# Patient Record
Sex: Male | Born: 1935 | Race: White | Hispanic: No | Marital: Married | Smoking: Former smoker
Health system: Southern US, Community
[De-identification: ages and names within clinical notes are randomized; demographics above are authoritative.]

## PROBLEM LIST (undated history)

## (undated) DIAGNOSIS — I2510 Atherosclerotic heart disease of native coronary artery without angina pectoris: Secondary | ICD-10-CM

## (undated) DIAGNOSIS — R079 Chest pain, unspecified: Secondary | ICD-10-CM

## (undated) DIAGNOSIS — F329 Major depressive disorder, single episode, unspecified: Secondary | ICD-10-CM

## (undated) DIAGNOSIS — T8859XA Other complications of anesthesia, initial encounter: Secondary | ICD-10-CM

## (undated) DIAGNOSIS — T4145XA Adverse effect of unspecified anesthetic, initial encounter: Secondary | ICD-10-CM

## (undated) DIAGNOSIS — F32A Depression, unspecified: Secondary | ICD-10-CM

## (undated) DIAGNOSIS — M109 Gout, unspecified: Secondary | ICD-10-CM

## (undated) DIAGNOSIS — R972 Elevated prostate specific antigen [PSA]: Secondary | ICD-10-CM

## (undated) DIAGNOSIS — E785 Hyperlipidemia, unspecified: Secondary | ICD-10-CM

## (undated) DIAGNOSIS — N400 Enlarged prostate without lower urinary tract symptoms: Secondary | ICD-10-CM

## (undated) DIAGNOSIS — Z125 Encounter for screening for malignant neoplasm of prostate: Secondary | ICD-10-CM

## (undated) HISTORY — PX: CATARACT EXTRACTION W/ INTRAOCULAR LENS  IMPLANT, BILATERAL: SHX1307

## (undated) HISTORY — PX: TONSILLECTOMY: SUR1361

## (undated) HISTORY — PX: CORONARY STENT PLACEMENT: SHX1402

## (undated) HISTORY — DX: Hyperlipidemia, unspecified: E78.5

## (undated) HISTORY — PX: TONSILLECTOMY: SHX5217

## (undated) HISTORY — DX: Chest pain, unspecified: R07.9

## (undated) HISTORY — DX: Atherosclerotic heart disease of native coronary artery without angina pectoris: I25.10

## (undated) HISTORY — DX: Encounter for screening for malignant neoplasm of prostate: Z12.5

## (undated) HISTORY — DX: Elevated prostate specific antigen (PSA): R97.20

---

## 2006-03-10 ENCOUNTER — Ambulatory Visit: Payer: Self-pay | Admitting: Cardiology

## 2006-03-10 ENCOUNTER — Ambulatory Visit: Payer: Self-pay | Admitting: Internal Medicine

## 2006-03-10 ENCOUNTER — Inpatient Hospital Stay (HOSPITAL_COMMUNITY): Admission: EM | Admit: 2006-03-10 | Discharge: 2006-03-12 | Payer: Self-pay | Admitting: Emergency Medicine

## 2006-03-18 ENCOUNTER — Ambulatory Visit: Payer: Self-pay | Admitting: Cardiology

## 2006-05-07 ENCOUNTER — Ambulatory Visit: Payer: Self-pay | Admitting: Cardiology

## 2006-05-30 ENCOUNTER — Ambulatory Visit: Payer: Self-pay | Admitting: Cardiology

## 2006-08-19 ENCOUNTER — Ambulatory Visit: Payer: Self-pay

## 2007-02-26 ENCOUNTER — Ambulatory Visit: Payer: Self-pay | Admitting: Cardiology

## 2008-02-02 ENCOUNTER — Ambulatory Visit: Payer: Self-pay

## 2008-02-04 ENCOUNTER — Ambulatory Visit: Payer: Self-pay | Admitting: Cardiology

## 2009-01-19 ENCOUNTER — Encounter: Payer: Self-pay | Admitting: Family Medicine

## 2009-01-19 LAB — CONVERTED CEMR LAB: PSA: 2.74 ng/mL

## 2009-03-17 DIAGNOSIS — R079 Chest pain, unspecified: Secondary | ICD-10-CM

## 2009-03-17 DIAGNOSIS — E785 Hyperlipidemia, unspecified: Secondary | ICD-10-CM

## 2009-03-17 DIAGNOSIS — I2510 Atherosclerotic heart disease of native coronary artery without angina pectoris: Secondary | ICD-10-CM

## 2009-03-28 ENCOUNTER — Ambulatory Visit: Payer: Self-pay | Admitting: Cardiology

## 2009-06-28 ENCOUNTER — Ambulatory Visit: Payer: Self-pay | Admitting: Family Medicine

## 2009-07-05 LAB — CONVERTED CEMR LAB
ALT: 19 units/L (ref 0–53)
Albumin: 4.6 g/dL (ref 3.5–5.2)
Alkaline Phosphatase: 62 units/L (ref 39–117)
Basophils Absolute: 0 10*3/uL (ref 0.0–0.1)
Basophils Relative: 0.1 % (ref 0.0–3.0)
Creatinine, Ser: 1 mg/dL (ref 0.4–1.5)
Glucose, Bld: 118 mg/dL — ABNORMAL HIGH (ref 70–99)
HDL: 33.9 mg/dL — ABNORMAL LOW (ref >39.00)
Hgb A1c MFr Bld: 5.8 % (ref 4.6–6.5)
Lymphocytes Relative: 20.8 % (ref 12.0–46.0)
Lymphs Abs: 1.2 10*3/uL (ref 0.7–4.0)
MCHC: 33.6 g/dL (ref 30.0–36.0)
PSA: 4.45 ng/mL — ABNORMAL HIGH (ref 0.10–4.00)
Phosphorus: 3.1 mg/dL (ref 2.3–4.6)
Potassium: 4.1 meq/L (ref 3.5–5.1)
RBC: 4.9 M/uL (ref 4.22–5.81)
Sodium: 140 meq/L (ref 135–145)
Total Bilirubin: 1.6 mg/dL — ABNORMAL HIGH (ref 0.3–1.2)
Total Protein: 6.9 g/dL (ref 6.0–8.3)
Triglycerides: 143 mg/dL (ref 0.0–149.0)
VLDL: 28.6 mg/dL (ref 0.0–40.0)

## 2009-07-24 ENCOUNTER — Encounter: Payer: Self-pay | Admitting: Family Medicine

## 2009-07-24 ENCOUNTER — Telehealth: Payer: Self-pay | Admitting: Family Medicine

## 2009-07-24 DIAGNOSIS — R972 Elevated prostate specific antigen [PSA]: Secondary | ICD-10-CM | POA: Insufficient documentation

## 2009-08-11 ENCOUNTER — Encounter: Payer: Self-pay | Admitting: Family Medicine

## 2009-08-21 ENCOUNTER — Telehealth: Payer: Self-pay | Admitting: Family Medicine

## 2009-08-23 ENCOUNTER — Ambulatory Visit: Payer: Self-pay | Admitting: Family Medicine

## 2011-03-05 NOTE — Assessment & Plan Note (Signed)
[name]                            CARDIOLOGY OFFICE NOTE   DALLIS, CZAJA                       MRN:          [id number]  DATE:02/04/2008                            DOB:          1934/03/21    Chad Sanford comes in today for follow-up of his coronary artery disease.  He is having no angina or ischemic symptoms.   He is status post a LAD Cypher stent to the LAD on Mar 11, 2006.  He has  nonobstructive disease otherwise.   His lipids are followed by Dr. Loma Sender.   CURRENT MEDICATIONS:  1. Aspirin 81 mg a day.  2. Simvastatin 40 mg nightly.  3. Multivitamin.   PHYSICAL EXAMINATION:  VITAL SIGNS:  His blood pressure is 128/80, pulse  72 and regular.  Weight is 200, up 6.  HEENT:  Unchanged.  NECK: Carotids upstrokes are equal bilateral without bruits.  No JVD.  Thyroid is not enlarged.  Trachea is midline.  LUNGS:  Clear to auscultation.  HEART:  Reveals a nondisplaced PMI.  Normal S1-S2.  ABDOMEN:  Slightly protuberant, good bowel sounds.  No tenderness.  No  obvious organomegaly.  No pulsatile mass.  EXTREMITIES:  No cyanosis, clubbing or edema.  Pulses are intact.  NEURO:  Intact.  SKIN:  Unremarkable.   He had an exercise rest stress Myoview on February 02, 2008.  He went 10  minutes and 33 seconds.  His heart rate went to 130 which is 87% of  predicted maximum heart rate.  His met level achieved was 11.3.  He did  have a hypertensive blood pressure response to exercise, though at the  stage that he normally pushes himself, he was in the 170s systolic.  His  scan showed normal perfusion, no evidence of ischemia or infarction.  EF  62%.   ASSESSMENT/PLAN:  Chad Sanford is doing well.  I have made no changes in  his program.  We reviewed the findings of his stress test in detail.  Will plan on seeing him back in a year.    Thomas C. Daleen Squibb, MD, [facility]  Electronically Signed   TCW/MedQ  DD: 02/04/2008  DT: 02/04/2008  Job #:  [id number]   cc:   Loma Sender

## 2011-03-05 NOTE — Assessment & Plan Note (Signed)
[facility]                            CARDIOLOGY OFFICE NOTE   Chad Sanford                       MRN:          [id number]  DATE:02/26/2007                            DOB:          February 15, 1934    Chad Sanford returns today for further management of his coronary artery  disease.  He is status post Cypher stent to the LAD on Mar 11, 2006.  He  had non-obstructive disease otherwise.   He had significant fatigue with Lopressor.  We stopped it and he got  better.   He has hyperlipidemia, and his lipids are being followed by Dr.  Vear Sanford.   He feels remarkably good without ischemic symptoms.   CURRENT MEDICATIONS:  1. Plavix 75 mg a day.  2. Vytorin 10/40.  3. Aspirin 325 a day.  4. Multivitamin daily.   PHYSICAL EXAMINATION:  VITAL SIGNS:  Blood pressure is 125/74, pulse 61  and regular.  Weight is 194.  HEENT:  Normocephalic and atraumatic.  Pupils equal, round and reactive  to light and accommodation.  Extraocular movements intact.  Sclerae are  clear.  Facial symmetry is normal.  Carotid upstrokes are equal  bilaterally without bruits.  There is no JVD.  Thyroid is not enlarged.  NECK:  Supple.  LUNGS:  Clear.  HEART:  Regular rate and rhythm.  Non-displaced PMI.  ABDOMEN:  Soft with good bowel sounds.  There is no midline bruit.  There is no hepatomegaly.  EXTREMITIES:  Without any clubbing, cyanosis, or edema.  Pulses are  brisk.  NEUROLOGIC:  Intact.   ELECTROCARDIOGRAM:  EKG is remarkable for sinus bradycardia; otherwise  normal.   ASSESSMENT AND PLAN:  Chad Sanford is doing well.  We renewed his  sublingual nitroglycerin.  Will discontinue his Plavix.  He will remain  on aspirin and Vytorin.  Labs will be followed by Chad Sanford.  I will  see him back in a year.     Chad C. Daleen Squibb, MD, [facility]  Electronically Signed   TCW/MedQ  DD: 02/26/2007  DT: 02/26/2007  Job #: [id number]   cc:   Chad Sanford

## 2011-03-08 NOTE — Consult Note (Signed)
Chad Sanford                ACCOUNT NO.:  [id number]   MEDICAL RECORD NO.:  [id number]          PATIENT TYPE:  INP   LOCATION:  3729                         FACILITY:  [facility]   PHYSICIAN:  Chad Sanford, M.D.   DATE OF BIRTH:  [birth year]   DATE OF CONSULTATION:  03/10/2006  DATE OF DISCHARGE:                                   CONSULTATION   We are asked by Dr. Meredith Pel to evaluate Chad Sanford, a delightful 81, soon  to be 77 year old, married white male, a retired Optician, dispensing, for exertional  angina.   This was new onset over the last several days.  He came to the emergency  room and a several nitroglycerin relieved it.   His risk factors are really age, sex, and a lot of stress.  There is a  family history of coronary disease and stroke.  Brother died of a heart  attack at age 15.   He does not know his current lipid status.  He last had it checked about a  year ago.   PAST MEDICAL HISTORY:  He has no known drug allergies.   He was o n a multivitamin prior to admission.  He is currently on aspirin,  Lovenox, Protonix, metoprolol 25 mg b.i.d..   Has had T&A.  He had a history of hemorrhoids.   SOCIAL HISTORY:  He lives in [place] with his wife.  Does not smoke but  has remote distant history of smoking.   FAMILY HISTORY:  As above.   REVIEW OF SYSTEMS:  Noncontributory or negative otherwise.   PHYSICAL EXAMINATION:  VITAL SIGNS:  His blood pressure 150/96.  His pulse  77 and regular, respiratory rate is 20, temperature is 98.4, saturations are  97% on room air.  GENERAL:  He is in no acute distress.  Skin is warm and dry.  NEUROLOGIC:  Intact.  HEENT:  Normocephalic, atraumatic.  PERRLA.  Extraocular movements intact.  Sclera clear.  Dentition is satisfactory.  NECK:  Carotid upstrokes were equal bilaterally without bruits.  No JVD.  Thyroid is not enlarged.  Trachea is midline.  LUNGS:  Clear to auscultation.  HEART:  Regular rate and rhythm without murmur,  gallop.  ABDOMEN:  Soft, good bowel sounds.  No midline bruits.  There is no  hepatomegaly.  There si no tenderness.  EXTREMITIES:  No sinus clubbing or edema.  Pulses are intact.   Electrocardiogram is essentially normal.   Laboratory data is unremarkable.  D-dimer was negative.  Point care markers  were negative x2.   ASSESSMENT/PLAN:  1.  Classic exertional angina, probably obstructive coronary disease.  2.  Question of hyperlipidemia.  3.  Family history of coronary disease.   RECOMMENDATIONS:  1.  Continue current medications.  2.  Fasting lipid panel.  3.  Cardiac catheterization.  Indications, benefit and risk have been      discussed with the patient and wife and they agree to proceed.  Will      schedule in the morning.      Chad Sanford, M.D.  Electronically Signed  TCW/MEDQ  D:  03/10/2006  T:  03/11/2006  Job:  [id number]   cc:   Loma Sender  Fax: [phone]   Ileana Roup, M.D.  Fax: [phone]

## 2011-03-08 NOTE — Assessment & Plan Note (Signed)
[facility]                              CARDIOLOGY OFFICE NOTE   KAROL, LIENDO                       MRN:          [id number]  DATE:05/30/2006                            DOB:          May 30, 1934    Mr. Chandonnet returns today for further management of his coronary artery  disease and hyperlipidemia.   Other than some fatigue and what sounds like some mild orthostatic symptoms  he is doing well.  He has cut-out salt completely and he has changed his  diet radically.   His lipid were at goal on Vytorin.  We stopped his Metoprolol last visit  trying to improve his fatigue.   MEDICATIONS:  1. He is on Plavix 75 mg a day.  2. Aspirin 325 a day.  3. Multivitamin.  4. Potassium over-the-counter.  5. Vitamin C q. day.   EXAMINATION:  VITAL SIGNS:  His blood pressure is 108/70.  His pulse is 70  and regular.  His weight is 191 and stable.  GENERAL:  He looks remarkably good.  HEART:  Reveals regular rate and rhythm.  LUNGS:  Clear.  EXTREMITIES:  No edema.  Pulses are intact.   Mr. Pursel is doing well.  I have asked him to stay on his current medical  program.  I have asked him to liberalize his salt and increase his fluids to  stay well hydrated.  I will see him back in May of 2008.                               Thomas C. Daleen Squibb, MD, [facility]    TCW/MedQ  DD:  05/30/2006  DT:  05/30/2006  Job #:  [id number]   cc:   Loma Sender

## 2011-03-08 NOTE — Discharge Summary (Signed)
Chad Sanford, SUGG NO.:  [id number]   MEDICAL RECORD NO.:  [id number]          PATIENT TYPE:  INP   LOCATION:  6525                         FACILITY:  [facility]   PHYSICIAN:  Ileana Roup, M.D.  DATE OF BIRTH:  12-10-1933   DATE OF ADMISSION:  03/10/2006  DATE OF DISCHARGE:  03/12/2006                                 DISCHARGE SUMMARY   DISCHARGE DIAGNOSES:  1.  Coronary artery disease status post left anterior descending stent      placed on Mar 11, 2006.  2.  Hypercholesterolemia.   DISCHARGE MEDICATIONS:  1.  Aspirin 325 mg p.o. daily.  2.  Plavix 75 mg p.o. daily.  3.  Vytorin 10/40 mg p.o. q.h.s.  4.  Lopressor 25 mg p.o. b.i.d.  5.  Nitroglycerin 0.4 mg sublingual p.r.n.  6.  Multivitamin daily.   FOLLOW UP:  1.  The patient is to follow up with Dr. Daleen Squibb of [facility] on Mar 18, 2006 status post catheterization and LAD stent placement on Mar 11, 2006.  2.  The patient is to follow up with his primary care physician, Dr.      Vear Clock, in [place].  The patient has been recently started on      Vytorin, as he had hypercholesterolemia and this needs to be monitored      by Dr. Vear Clock.  Liver function test is recommended in 4-6 week's time.   PROCEDURES DONE THIS ADMISSION:  Cardiac catheterization done on Mar 11, 2006 with LAD stent placement.   CONSULTANTS THIS ADMISSION:  Dr. Daleen Squibb of [facility] and Dr. Juanda Chance  of [facility].   IMAGES DONE THIS ADMISSION:  None.   BRIEF HISTORY AND PHYSICAL:  Please refer to medical record for further  details.  Chad Sanford is a 77 year old Caucasian man with no significant past  medical history that comes to the ED with a 1-week history of exertional  chest pain that starts when he goes for his walks and relieved on rest.  It  is associated with some minor shortness of breath, but no complaints of  diaphoresis, nausea or vomiting.  The patient denies any fever or  chills,  cough or any GI symptoms.   ALLERGIES:  NO KNOWN DRUG ALLERGIES.   PAST MEDICAL HISTORY:  Non-significant past medical history.   MEDICATIONS:  Multivitamin daily.   SUBSTANCE HISTORY:  The patient is a former smoker.  He quit 40 years ago.  The patient does not drink any alcohol and does not have a history of drug  abuse.   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   FAMILY HISTORY:  His mother died at 46 from a stroke.  Father died at 88  from a stroke as well.  Siblings:  The patient has 6 siblings and neither of  them have an early history of an MI.   PHYSICAL EXAMINATION:  VITAL SIGNS:  Temperature 98.4, blood pressure  158/96, pulse 77,  respiratory rate of 20 and O2 sat of 97% on room air.  GENERAL APPEARANCE:  The patient did not appear in any acute distress.  EYES:  Pupils equal, round, reactive to light.  Extraocular movements are  intact.  ENT:  Oropharynx is clear.  No erythema or exudate.  NECK:  Supple.  No adenopathy.  No JVD.  No carotid bruit.  LUNGS:  Clear to auscultation bilaterally.  No wheezes or rhonchi.  HEART:  Regular rhythm, no murmurs, rubs or gallops.  ABDOMEN:  Soft, nondistended, nontender and bowel sounds present.  EXTREMITIES:  No edema.  Pulses 2+ bilaterally.  NEUROLOGICALLY:  Alert and oriented x3.  Grossly nonfocal.  PSYCHIATRIC:  Appropriate.   LABORATORY:  Sodium 137, potassium 4.2, chloride 107, bicarb 27, BUN 13,  creatinine 1.2 and glucose of 97.  Hemoglobin 15, white cell count 10.9,  platelets oxygen saturation 284 and MCV 89.  ANC 8.2.  EKG shows normal  sinus rhythm, normal intervals and no ST or T wave changes.   HOSPITAL COURSE:  Problem #1.  Coronary artery disease.  The patient came in  with exertional chest pain and therefore was concerning.  He had a remote  history of smoking and has hypertension.  Lipid status was unknown on the  time of admission and no history  of diabetes.  The patient was given  aspirin, and nitroglycerin sublingually x1 in the ED and was also given a  beta blocker Toprol-XL 25 mg x1.  Blood pressure came down with the above  and a cardiology consult was called.  Dr. Daleen Squibb patient in the ED and patient  was scheduled for a cardiac cath the following day.  On Mar 11, 2006, the  patient had a cardiac catheterization done by Dr. Charlies Constable.  LAD was  found to be 95% stenosed and an LAD stent was placed.  His ejection fraction  was estimated around 60%.  Post catheterization, the patient did develop  some amount of swelling in his catheterization site.  It was a small 4 cm  swelling and mildly tender to touch.  Ultrasound was done and was found to  just be a hematoma and not a pseudoaneurysm.  The patient was therefore  stable to go home and it was okayed by cardiology for him to go home as  well.  The patient will be following up with Dr. Daleen Squibb on Mar 18, 2006.  The  patient has been started on Plavix 75 mg p.o. daily, along with continuation  of aspirin and Lopressor 25 mg b.i.d..  The patient was instructed if he  does get chest pain again that he needs to take nitroglycerin and report  immediately to the ED.   Problem #2.  Hypercholesterolemia.  Fasting lipid panel was done here in the  hospital.  He has a total cholesterol of 219, TG of 164, HDL of 31 and LDL  155.  VLDL of 33.  Vytorin 10/40 mg q.h.s. was added to his drug list.  The  patient needs follow up with his primary care physician regarding this issue  and needs his liver function test to be tested in 4-6 weeks.   DISCHARGE VITAL SIGNS:  Temperature 98.6, blood pressure in the range of 106-  118/52-60, pulse of 70-74, respiratory rate 18-20 and O2 sat 94% on room  air.   DISCHARGE LABORATORY DATA:  Sodium 140, potassium 4, chloride 106, bicarb 28, BUN 10, creatinine 1.1 and glucose 108.  Hemoglobin 14.4, hematocrit  42.3, white cell count 7.8 and platelets  275.      Chad Sanford, M.D.    ______________________________  Ileana Roup, M.D.    SS/MEDQ  D:  03/12/2006  T:  03/12/2006  Job:  [id number]   cc:   Loma Sender  Fax: [phone]   Jesse Sans. Wall, M.D.  [address]  [address]  [place]  [place] [address]

## 2011-03-08 NOTE — Cardiovascular Report (Signed)
NAME:  Chad Sanford, Chad Sanford NO.:  [id number]   MEDICAL RECORD NO.:  [id number]          PATIENT TYPE:  INP   LOCATION:  3729                         FACILITY:  [facility]   PHYSICIAN:  Charlies Constable, M.D. 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  He does have a positive family  history for heart disease and a markedly elevated LDL.  Over the last week  he has developed exertional chest pain and came to the emergency room  yesterday and was admitted by Dr. Terrial Rhodes service and seen in consultation  by Dr. Daleen Squibb and scheduled for evaluation angiography.   PROCEDURE:  The procedure was performed by the right femoral artery using an  arterial sheath and 6-French preformed coronary catheters.  A femoral wall  arterial puncture was performed and Omnipaque contrast was used.  After  completion of the diagnostic study we made a decision to proceed with  intervention on the lesion in the proximal LAD.   The patient was given Angiomax bolus infusion, was given 600 mg of Plavix  load, and 20 mg of Pepcid.  We used a Q-4 6-French guiding catheter with  side holes.  We crossed the lesion in the proximal LAD with a Prowater wire  without difficulty.  We predilated with 2.25 x 20 mm Maverick stent  performing two inflation up to 10 atmospheres for 30 seconds.  We then  deployed a 2.5 x 18 mm Cypher stent, deploying this with one inflation of 9  atmospheres for 30 seconds.  We post dilated with 0.5 x 50 mm Quantum  Maverick, performing two inflations up to 16 atmospheres for about 30  seconds.  Final diagnostic study was then performed through the guiding  catheter.  The patient tolerated the procedure well and left the laboratory  in satisfactory condition.   RESULTS:  The aortic  pressure was 132/79 with mean of 103 and left  ventricular pressure was 132/16.   The left main coronary artery is free of significant disease.   The left anterior descending artery gave rise to a large diagonal branch,  two septal perforators, and two small diagonal branches.  There was 40%  proximal and a 95% proximal stenosis in the LAD.  The 95% stenosis had  segmental disease extending about 16 mm.  There was 40% narrowing in the mid  LAD and irregularity throughout the proximal and mid LAD.  There was 50%  stenosis in one of the subbranches of the first large diagonal branch.   The circumflex artery gave rise to a small marginal branch and a large and  small posterolateral branch.  There were tandem 40% stenoses in the distal  circumflex artery before the posterolateral branches.   The right coronary artery is a large dominant vessel that gave rise to a  small ventricular branch, a moderate ventricular branch, two posterior  descending branches, and two posterolateral  branches.  There was 30% mid and  30% distal stenosis in the right coronary.   The left ventriculogram performed in the RAO projection showed good wall  motion with no areas of hypokinesis.  The estimated ejection fraction was  60%.   Following stenting of the lesion in the proximal LAD, the stenosis improved  from 95% to 0%.   CONCLUSION:  1.  Coronary artery disease with 40% proximal and 95% proximal stenosis in      the left anterior descending coronary artery, with 40% stenosis in the      mid left anterior descending coronary artery and 50% stenosis in the      first large diagonal branch; 40% narrowing in the distal circumflex      artery; 30% mid and 30% distal stenosis in the right coronary artery;      and normal left ventricular function.  2.  Successful percutaneous coronary intervention of the lesion in the      proximal left anterior descending coronary artery using a Cypher drug-      eluting  stent with improvement in percent of narrowing from 95 to 0%.   DISPOSITION:  The patient was transferred to the post angioplasty unit for  further observation.           ______________________________  Charlies Constable, M.D. [facility]     BB/MEDQ  D:  03/11/2006  T:  03/11/2006  Job:  [id number]   cc:   Loma Sender  Fax: [phone]   Jesse Sans. Wall, M.D.  [address]  [address]  [place]  [place] [address]   Cardiopulmonary Lab

## 2011-03-15 ENCOUNTER — Encounter: Payer: Self-pay | Admitting: Cardiology

## 2011-03-27 ENCOUNTER — Encounter: Payer: Self-pay | Admitting: Cardiology

## 2011-03-27 ENCOUNTER — Ambulatory Visit (INDEPENDENT_AMBULATORY_CARE_PROVIDER_SITE_OTHER): Payer: Medicare Other | Admitting: Cardiology

## 2011-03-27 VITALS — BP 122/82 | HR 67 | Ht 73.0 in | Wt 194.0 lb

## 2011-03-27 DIAGNOSIS — I2510 Atherosclerotic heart disease of native coronary artery without angina pectoris: Secondary | ICD-10-CM

## 2011-03-27 NOTE — Patient Instructions (Signed)
Your physician recommends that you schedule a follow-up appointment in: 1 year with Dr. Wall  

## 2011-03-27 NOTE — Assessment & Plan Note (Signed)
Stable, no change in meds. Return in one year.

## 2011-03-27 NOTE — Progress Notes (Signed)
HPI Chad Sanford comes in today for the evaluation of his coronary disease. He is 5 years out from his stent implantation. He is very active and is having no symptoms of angina or ischemia. He cannot take aspirin because of bleeding from the bladder. No specific etiology was determined by cystoscopy in the past.  His lipids are followed by Dr. Vear Clock. He remains on simvastatin. Past Medical History  Diagnosis Date  . Chest pain, unspecified     exertional  . CAD (coronary artery disease)     unspecified site, stent put in   . Hyperlipidemia, mixed     Past Surgical History  Procedure Date  . Tonsillectomy     No family history on file.  History   Social History  . Marital Status: Married    Spouse Name: N/A    Number of Children: N/A  . Years of Education: N/A   Occupational History  . retired      Optician, dispensing    Social History Main Topics  . Smoking status: Former Games developer  . Smokeless tobacco: Not on file   Comment: quit 40+ years ago   . Alcohol Use: No  . Drug Use: No  . Sexually Active: Not on file   Other Topics Concern  . Not on file   Social History Narrative   Married. Walks for exercise- really enjoys it.     Allergies  Allergen Reactions  . Aspirin     REACTION: blood in urine    Current Outpatient Prescriptions  Medication Sig Dispense Refill  . multivitamin (THERAGRAN) per tablet Take 1 tablet by mouth daily.        . nitroGLYCERIN (NITROSTAT) 0.4 MG SL tablet Place 0.4 mg under the tongue every 5 (five) minutes as needed.        . simvastatin (ZOCOR) 40 MG tablet Take 40 mg by mouth daily.          ROS Negative other than HPI.   PE General Appearance: well developed, well nourished in no acute distress HEENT: symmetrical face, PERRLA, good dentition  Neck: no JVD, thyromegaly, or adenopathy, trachea midline Chest: symmetric without deformity Cardiac: PMI non-displaced, RRR, normal S1, S2, no gallop or murmur Lung: clear to ausculation and  percussion Vascular: all pulses full without bruits  Abdominal: nondistended, nontender, good bowel sounds, no HSM, no bruits Extremities: no cyanosis, clubbing or edema, no sign of DVT, no varicosities  Skin: normal color, no rashes Neuro: alert and oriented x 3, non-focal Pysch: normal affect Filed Vitals:   03/27/11 1518  BP: 122/82  Pulse: 67  Height: 6\' 1"  (1.854 m)  Weight: 194 lb (87.998 kg)    EKG  Labs and Studies Reviewed.   Lab Results  Component Value Date   WBC 5.7 06/28/2009   HGB 15.1 06/28/2009   HCT 44.8 06/28/2009   MCV 91.5 06/28/2009   PLT 275.0 06/28/2009      Chemistry      Component Value Date/Time   NA 140 06/28/2009 1029   K 4.1 06/28/2009 1029   CL 104 06/28/2009 1029   CO2 28 06/28/2009 1029   BUN 12 06/28/2009 1029   CREATININE 1.0 06/28/2009 1029      Component Value Date/Time   CALCIUM 9.3 06/28/2009 1029   ALKPHOS 62 06/28/2009 1029   AST 27 06/28/2009 1029   ALT 19 06/28/2009 1029   BILITOT 1.6* 06/28/2009 1029       Lab Results  Component Value Date  CHOL 146 06/28/2009   Lab Results  Component Value Date   HDL 33.90* 06/28/2009   Lab Results  Component Value Date   LDLCALC 84 06/28/2009   Lab Results  Component Value Date   TRIG 143.0 06/28/2009   Lab Results  Component Value Date   CHOLHDL 4 06/28/2009   Lab Results  Component Value Date   HGBA1C 5.8 06/28/2009   Lab Results  Component Value Date   ALT 19 06/28/2009   AST 27 06/28/2009   ALKPHOS 62 06/28/2009   BILITOT 1.6* 06/28/2009   Lab Results  Component Value Date   TSH 1.48 06/28/2009

## 2011-03-29 NOTE — Progress Notes (Signed)
Addended by: Burnett Kanaris A on: 03/29/2011 11:15 AM   Modules accepted: Orders

## 2012-05-07 ENCOUNTER — Encounter: Payer: Self-pay | Admitting: *Deleted

## 2012-05-26 ENCOUNTER — Ambulatory Visit (INDEPENDENT_AMBULATORY_CARE_PROVIDER_SITE_OTHER): Payer: Medicare Other | Admitting: Cardiology

## 2012-05-26 ENCOUNTER — Encounter: Payer: Self-pay | Admitting: Cardiology

## 2012-05-26 VITALS — BP 144/84 | HR 65 | Ht 73.0 in | Wt 198.0 lb

## 2012-05-26 DIAGNOSIS — R079 Chest pain, unspecified: Secondary | ICD-10-CM

## 2012-05-26 DIAGNOSIS — E785 Hyperlipidemia, unspecified: Secondary | ICD-10-CM

## 2012-05-26 DIAGNOSIS — I2510 Atherosclerotic heart disease of native coronary artery without angina pectoris: Secondary | ICD-10-CM

## 2012-05-26 MED ORDER — NITROGLYCERIN 0.4 MG SL SUBL: 0.4000 mg | SUBLINGUAL_TABLET | SUBLINGUAL | Status: DC | PRN

## 2012-05-26 NOTE — Patient Instructions (Addendum)
Your physician recommends that you continue on your current medications as directed. Please refer to the Current Medication list given to you today.  Your physician recommends that you schedule a follow-up appointment in: 1year with Dr. Wall.  

## 2012-05-26 NOTE — Assessment & Plan Note (Signed)
Followed by Dr. Vear Clock of primary care.

## 2012-05-26 NOTE — Progress Notes (Signed)
HPI Mr Age returns today for evaluation and management of his history of coronary disease and PCI.  He's having no symptoms of angina or ischemic heart disease. He denies TIAs or any claudication.  He is compliant with his medications. He carries sublingual nitroglycerin. He has not had to use it.    Past Medical History  Diagnosis Date  . Chest pain, unspecified     exertional  . Coronary atherosclerosis of unspecified type of vessel, native or graft     unspecified site, stent put in   . Other and unspecified hyperlipidemia   . Elevated prostate specific antigen (PSA)   . Special screening for malignant neoplasm of prostate     Current Outpatient Prescriptions  Medication Sig Dispense Refill  . multivitamin (THERAGRAN) per tablet Take 1 tablet by mouth daily.        . nitroGLYCERIN (NITROSTAT) 0.4 MG SL tablet Place 0.4 mg under the tongue every 5 (five) minutes as needed.        . simvastatin (ZOCOR) 40 MG tablet Take 40 mg by mouth daily.          Allergies  Allergen Reactions  . Aspirin     REACTION: blood in urine    Family History  Problem Relation Age of Onset  . Stroke Mother 76  . Stroke Father 60    History   Social History  . Marital Status: Married    Spouse Name: N/A    Number of Children: N/A  . Years of Education: N/A   Occupational History  . retired      Optician, dispensing    Social History Main Topics  . Smoking status: Former Games developer  . Smokeless tobacco: Not on file   Comment: quit in [phone]  . Alcohol Use: No  . Drug Use: No  . Sexually Active: Not on file   Other Topics Concern  . Not on file   Social History Narrative   Married. Walks for exercise- really enjoys it.     ROS ALL NEGATIVE EXCEPT THOSE NOTED IN HPI  PE  General Appearance: well developed, well nourished in no acute distress, looks younger than stated age HEENT: symmetrical face, PERRLA, good dentition  Neck: no JVD, thyromegaly, or adenopathy, trachea  midline Chest: symmetric without deformity Cardiac: PMI non-displaced, RRR, normal S1, S2, no gallop or murmur Lung: clear to ausculation and percussion Vascular: all pulses full without bruits  Abdominal: nondistended, nontender, good bowel sounds, no HSM, no bruits Extremities: no cyanosis, clubbing or edema, no sign of DVT, no varicosities  Skin: normal color, no rashes Neuro: alert and oriented x 3, non-focal Pysch: normal affect  EKG Normal sinus rhythm, normal EKG BMET    Component Value Date/Time   NA 140 06/28/2009 1029   K 4.1 06/28/2009 1029   CL 104 06/28/2009 1029   CO2 28 06/28/2009 1029   GLUCOSE 118* 06/28/2009 1029   BUN 12 06/28/2009 1029   CREATININE 1.0 06/28/2009 1029   CALCIUM 9.3 06/28/2009 1029    Lipid Panel     Component Value Date/Time   CHOL 146 06/28/2009 1029   TRIG 143.0 06/28/2009 1029   HDL 33.90* 06/28/2009 1029   CHOLHDL 4 06/28/2009 1029   VLDL 28.6 06/28/2009 1029   LDLCALC 84 06/28/2009 1029    CBC    Component Value Date/Time   WBC 5.7 06/28/2009 1029   RBC 4.90 06/28/2009 1029   HGB 15.1 06/28/2009 1029   HCT 44.8 06/28/2009 1029  PLT 275.0 06/28/2009 1029   MCV 91.5 06/28/2009 1029   MCHC 33.6 06/28/2009 1029   RDW 12.3 06/28/2009 1029   LYMPHSABS 1.2 06/28/2009 1029   MONOABS 0.4 06/28/2009 1029   EOSABS 0.2 06/28/2009 1029   BASOSABS 0.0 06/28/2009 1029

## 2012-05-26 NOTE — Assessment & Plan Note (Signed)
Asymptomatic. Continue secondary preventative therapy. Nitroglycerin up to date.

## 2012-06-01 NOTE — Addendum Note (Signed)
Addended by: Micki Riley C on: 06/01/2012 03:47 PM   Modules accepted: Orders

## 2013-08-06 ENCOUNTER — Encounter (HOSPITAL_COMMUNITY): Payer: Self-pay | Admitting: Emergency Medicine

## 2013-08-06 ENCOUNTER — Encounter (HOSPITAL_COMMUNITY)
Admission: EM | Disposition: A | Discharge status: Home / Self Care | Payer: Self-pay | Source: Home / Self Care | Attending: Internal Medicine

## 2013-08-06 ENCOUNTER — Emergency Department (HOSPITAL_COMMUNITY): Payer: Medicare Other

## 2013-08-06 ENCOUNTER — Inpatient Hospital Stay (HOSPITAL_COMMUNITY)
Admission: EM | Admit: 2013-08-06 | Discharge: 2013-08-09 | DRG: 377 | Disposition: A | Discharge status: Home / Self Care | Payer: Medicare Other | Attending: Internal Medicine | Admitting: Internal Medicine

## 2013-08-06 DIAGNOSIS — K264 Chronic or unspecified duodenal ulcer with hemorrhage: Principal | ICD-10-CM | POA: Diagnosis present

## 2013-08-06 DIAGNOSIS — D62 Acute posthemorrhagic anemia: Secondary | ICD-10-CM | POA: Diagnosis present

## 2013-08-06 DIAGNOSIS — Z87891 Personal history of nicotine dependence: Secondary | ICD-10-CM

## 2013-08-06 DIAGNOSIS — Z9861 Coronary angioplasty status: Secondary | ICD-10-CM

## 2013-08-06 DIAGNOSIS — K922 Gastrointestinal hemorrhage, unspecified: Secondary | ICD-10-CM | POA: Diagnosis present

## 2013-08-06 DIAGNOSIS — R578 Other shock: Secondary | ICD-10-CM | POA: Diagnosis present

## 2013-08-06 DIAGNOSIS — E785 Hyperlipidemia, unspecified: Secondary | ICD-10-CM | POA: Diagnosis present

## 2013-08-06 DIAGNOSIS — I2510 Atherosclerotic heart disease of native coronary artery without angina pectoris: Secondary | ICD-10-CM | POA: Diagnosis present

## 2013-08-06 HISTORY — PX: ESOPHAGOGASTRODUODENOSCOPY: SHX5428

## 2013-08-06 LAB — OCCULT BLOOD, POC DEVICE: Fecal Occult Bld: POSITIVE — AB

## 2013-08-06 LAB — CBC WITH DIFFERENTIAL/PLATELET
Hemoglobin: 12.2 g/dL — ABNORMAL LOW (ref 13.0–17.0)
Lymphocytes Relative: 29 % (ref 12–46)
Lymphs Abs: 3.9 10*3/uL (ref 0.7–4.0)
MCHC: 35 g/dL (ref 30.0–36.0)
Monocytes Absolute: 1 10*3/uL (ref 0.1–1.0)
Monocytes Relative: 7 % (ref 3–12)
Neutro Abs: 7.9 10*3/uL — ABNORMAL HIGH (ref 1.7–7.7)
RBC: 3.95 MIL/uL — ABNORMAL LOW (ref 4.22–5.81)

## 2013-08-06 LAB — TYPE AND SCREEN
ABO/RH(D): A POS
Antibody Screen: NEGATIVE

## 2013-08-06 LAB — COMPREHENSIVE METABOLIC PANEL
Calcium: 8.5 mg/dL (ref 8.4–10.5)
Glucose, Bld: 153 mg/dL — ABNORMAL HIGH (ref 70–99)
Sodium: 141 mEq/L (ref 135–145)
Total Bilirubin: 0.6 mg/dL (ref 0.3–1.2)

## 2013-08-06 LAB — POCT I-STAT TROPONIN I: Troponin i, poc: 0 ng/mL (ref 0.00–0.08)

## 2013-08-06 LAB — CBC
HCT: 28.9 % — ABNORMAL LOW (ref 39.0–52.0)
MCH: 30.2 pg (ref 26.0–34.0)
MCHC: 34.6 g/dL (ref 30.0–36.0)
MCV: 87.3 fL (ref 78.0–100.0)
Platelets: 243 10*3/uL (ref 150–400)
RDW: 13.3 % (ref 11.5–15.5)
WBC: 6.6 10*3/uL (ref 4.0–10.5)

## 2013-08-06 LAB — HEMOGLOBIN AND HEMATOCRIT, BLOOD
HCT: 27.2 % — ABNORMAL LOW (ref 39.0–52.0)
Hemoglobin: 9.5 g/dL — ABNORMAL LOW (ref 13.0–17.0)

## 2013-08-06 LAB — MRSA PCR SCREENING: MRSA by PCR: NEGATIVE

## 2013-08-06 SURGERY — EGD (ESOPHAGOGASTRODUODENOSCOPY)
Anesthesia: Moderate Sedation

## 2013-08-06 MED ORDER — SIMVASTATIN 40 MG PO TABS
40.0000 mg | ORAL_TABLET | Freq: Every day | ORAL | Status: DC
  Administered 2013-08-07 – 2013-08-09 (×3): 40 mg via ORAL
  Filled 2013-08-06 (×4): qty 1

## 2013-08-06 MED ORDER — SODIUM CHLORIDE 0.9 % IV SOLN
8.0000 mg/h | INTRAVENOUS | Status: DC
  Administered 2013-08-06 – 2013-08-08 (×4): 8 mg/h via INTRAVENOUS
  Filled 2013-08-06 (×13): qty 80

## 2013-08-06 MED ORDER — SODIUM CHLORIDE 0.9 % IV SOLN: INTRAVENOUS | Status: DC

## 2013-08-06 MED ORDER — MIDAZOLAM HCL 5 MG/ML IJ SOLN
INTRAMUSCULAR | Status: AC
  Filled 2013-08-06: qty 2

## 2013-08-06 MED ORDER — FENTANYL CITRATE 0.05 MG/ML IJ SOLN
INTRAMUSCULAR | Status: DC | PRN
  Administered 2013-08-06: 12.5 ug via INTRAVENOUS
  Administered 2013-08-06 (×2): 25 ug via INTRAVENOUS

## 2013-08-06 MED ORDER — MIDAZOLAM HCL 10 MG/2ML IJ SOLN
INTRAMUSCULAR | Status: DC | PRN
  Administered 2013-08-06: 1 mg via INTRAVENOUS
  Administered 2013-08-06: 2 mg via INTRAVENOUS

## 2013-08-06 MED ORDER — ONDANSETRON HCL 4 MG/2ML IJ SOLN
4.0000 mg | Freq: Once | INTRAMUSCULAR | Status: AC
  Administered 2013-08-06: 4 mg via INTRAVENOUS
  Filled 2013-08-06: qty 2

## 2013-08-06 MED ORDER — SODIUM CHLORIDE 0.9 % IJ SOLN
3.0000 mL | Freq: Two times a day (BID) | INTRAMUSCULAR | Status: DC
  Administered 2013-08-08 – 2013-08-09 (×2): 3 mL via INTRAVENOUS

## 2013-08-06 MED ORDER — SODIUM CHLORIDE 0.9 % IJ SOLN
INTRAMUSCULAR | Status: DC | PRN
  Administered 2013-08-06 (×2)

## 2013-08-06 MED ORDER — FENTANYL CITRATE 0.05 MG/ML IJ SOLN
INTRAMUSCULAR | Status: AC
  Filled 2013-08-06: qty 2

## 2013-08-06 MED ORDER — BUTAMBEN-TETRACAINE-BENZOCAINE 2-2-14 % EX AERO
INHALATION_SPRAY | CUTANEOUS | Status: DC | PRN
  Administered 2013-08-06: 2 via TOPICAL

## 2013-08-06 MED ORDER — PANTOPRAZOLE SODIUM 40 MG IV SOLR: 40.0000 mg | Freq: Two times a day (BID) | INTRAVENOUS | Status: DC

## 2013-08-06 MED ORDER — EPINEPHRINE HCL 0.1 MG/ML IJ SOSY
PREFILLED_SYRINGE | INTRAMUSCULAR | Status: AC
  Filled 2013-08-06: qty 10

## 2013-08-06 MED ORDER — SODIUM CHLORIDE 0.9 % IV SOLN
INTRAVENOUS | Status: DC
  Administered 2013-08-06 – 2013-08-07 (×4): via INTRAVENOUS
  Administered 2013-08-07: 20 mL/h via INTRAVENOUS

## 2013-08-06 MED ORDER — SODIUM CHLORIDE 0.9 % IV SOLN
80.0000 mg | Freq: Once | INTRAVENOUS | Status: AC
  Administered 2013-08-06: 80 mg via INTRAVENOUS
  Filled 2013-08-06: qty 80

## 2013-08-06 MED ORDER — SODIUM CHLORIDE 0.9 % IV BOLUS (SEPSIS)
2000.0000 mL | Freq: Once | INTRAVENOUS | Status: AC
  Administered 2013-08-06: 2000 mL via INTRAVENOUS

## 2013-08-06 NOTE — H&P (Signed)
[facility] History and Physical  ROHAIL KLEES NWG:[id number] DOB: 11/18/1933 DOA: 08/06/2013  Referring physician: ED PCP: Raliegh Ip, MD   Chief Complaint: Melena  HPI: Chad Sanford is a 79 y.o. male who presents to the ED with melena.  Symptoms onset Wed, and patient states that the amount of blood loss in his stool has been worsening each time he has a BM.  No abdominal pain, mild nausea but no vomiting.  Has had a couple of melanotic BMs here in the ED as well.  HGB 12.  Initially tachycardic at 109 this resolved with fluids, did drop pressures into the 90s for a while but has come back up into the 120s with fluids.  Review of Systems: Patient states he feels "shaky inside" 12 systems reviewed and otherwise negative.  Past Medical History  Diagnosis Date  . Chest pain, unspecified     exertional  . Coronary atherosclerosis of unspecified type of vessel, native or graft     unspecified site, stent put in   . Other and unspecified hyperlipidemia   . Elevated prostate specific antigen (PSA)   . Special screening for malignant neoplasm of prostate    Past Surgical History  Procedure Laterality Date  . Tonsillectomy    . Coronary stent placement      CAD   Social History:  reports that he has quit smoking. He does not have any smokeless tobacco history on file. He reports that he does not drink alcohol or use illicit drugs.   Allergies  Allergen Reactions  . Aspirin     REACTION: blood in urine    Family History  Problem Relation Age of Onset  . Stroke Mother 16  . Stroke Father 30    Prior to Admission medications   Medication Sig Start Date End Date Taking? Authorizing Provider  multivitamin [facility]) per tablet Take 1 tablet by mouth daily.      Historical Provider, MD  nitroGLYCERIN (NITROSTAT) 0.4 MG SL tablet Place 1 tablet (0.4 mg total) under the tongue every 5 (five) minutes as needed. 05/26/12   Gaylord Shih, MD  simvastatin (ZOCOR) 40 MG  tablet Take 40 mg by mouth daily.      Historical Provider, MD   Physical Exam: Filed Vitals:   08/06/13 0554  BP: 124/73  Pulse: 87  Temp:   Resp: 21     General:  NAD, patient fatigued appearing.  Eyes: PEERLA EOMI  ENT: mucous membranes moist  Neck: supple w/o JVD  Cardiovascular: RRR w/o MRG  Respiratory: CTA B  Abdomen: soft, nt, nd, bs+  Skin: no rash nor lesion  Musculoskeletal: MAE, full ROM all 4 extremities  Psychiatric: normal tone and affect  Neurologic: AAOx3, grossly non-focal   Labs on Admission:  Basic Metabolic Panel:  Recent Labs Lab 08/06/13 0417  NA 141  K 3.6  CL 103  CO2 27  GLUCOSE 153*  BUN 48*  CREATININE 0.89  CALCIUM 8.5   Liver Function Tests:  Recent Labs Lab 08/06/13 0417  AST 16  ALT 14  ALKPHOS 50  BILITOT 0.6  PROT 6.3  ALBUMIN 3.6    Recent Labs Lab 08/06/13 0417  LIPASE 29   No results found for this basename: AMMONIA,  in the last 168 hours CBC:  Recent Labs Lab 08/06/13 0417  WBC 13.3*  NEUTROABS 7.9*  HGB 12.2*  HCT 34.9*  MCV 88.4  PLT 315   Cardiac Enzymes: No results found for this  basename: CKTOTAL, CKMB, CKMBINDEX, TROPONINI,  in the last 168 hours  BNP (last 3 results) No results found for this basename: PROBNP,  in the last 8760 hours CBG: No results found for this basename: GLUCAP,  in the last 168 hours  Radiological Exams on Admission: Dg Chest Port 1 View  08/06/2013   CLINICAL DATA:  Upper GI bleed, abdominal pain, prior smoker  EXAM: PORTABLE CHEST - 1 VIEW  COMPARISON:  Prior radiograph from 03/10/2006  FINDINGS: The cardiac and mediastinal silhouettes are similar in size and contour as compared to the prior examination, and remain within normal limits. Mild tortuosity of the intrathoracic aorta is noted.  The lungs are mildly hyperinflated. Bibasilar linear opacities are most consistent with subsegmental atelectasis. No focal infiltrate identified. No pulmonary edema or  pleural effusion. No pneumothorax.  Osseous structures are within normal limits.  IMPRESSION: Mild hyperinflation with bibasilar subsegmental atelectasis.   Electronically Signed   By: Rise Mu M.D.   On: 08/06/2013 04:42    EKG: Independently reviewed.  Assessment/Plan Principal Problem:   UGI bleed   1. UGIB - on PPI bolus and GTT, IVF at 125 cc/hr, repeat H/H at 12 noon, transfuse if needed though no indications for transfusion at this time with last HGB of 12, spoke with GI, Dr. Dulce Sellar will be in this morning to see the patient, likely endoscopy shortly thereafter this morning.  Patient NPO, headed to SDU for closer monitoring and because he dropped his BP in the ED, BP did return to 120s after IVF.    Code Status: Full (must indicate code status--if unknown or must be presumed, indicate so) Family Communication: Wife at bedside (indicate person spoken with, if applicable, with phone number if by telephone) Disposition Plan: Admit to SDU (indicate anticipated LOS)  Time spent: 70 min  Zafira Munos M. [facility] Pager [phone]  If 7PM-7AM, please contact night-coverage [email or web address] Password [email or web address] 08/06/2013, 6:16 AM

## 2013-08-06 NOTE — ED Provider Notes (Signed)
CSN: [id number]     Arrival date & time 08/06/13  9147 History   First MD Initiated Contact with Patient 08/06/13 0350     Chief Complaint  Patient presents with  . Rectal Bleeding   (Consider location/radiation/quality/duration/timing/severity/associated sxs/prior Treatment) HPI 79 year old male does not have any history of GI bleeds and does not take blood thinners but has a 2 day history of several black tarry stools with generalized fatigue and within the last hour has some slight vague upper abdominal discomfort but no chest pain no shortness of breath no lightheadedness no syncope no fever no altered mental status no trauma he had mild nausea yesterday but no vomiting. There is no treatment prior to arrival. Past Medical History  Diagnosis Date  . Chest pain, unspecified     exertional  . Coronary atherosclerosis of unspecified type of vessel, native or graft     unspecified site, stent put in   . Other and unspecified hyperlipidemia   . Elevated prostate specific antigen (PSA)   . Special screening for malignant neoplasm of prostate    Past Surgical History  Procedure Laterality Date  . Tonsillectomy    . Coronary stent placement      CAD  . Tonsillectomy    . Esophagogastroduodenoscopy N/A 08/06/2013    Procedure: ESOPHAGOGASTRODUODENOSCOPY (EGD);  Surgeon: Graylin Shiver, MD;  Location: [facility] ENDOSCOPY;  Service: Endoscopy;  Laterality: N/A;   Family History  Problem Relation Age of Onset  . Stroke Mother 82  . Stroke Father 82   History  Substance Use Topics  . Smoking status: Former Games developer  . Smokeless tobacco: Not on file     Comment: quit in [phone]  . Alcohol Use: No    Review of Systems 10 Systems reviewed and are negative for acute change except as noted in the HPI. Allergies  Aspirin  Home Medications   Current Outpatient Rx  Name  Route  Sig  Dispense  Refill  . Cyanocobalamin (VITAMIN B 12 PO)   Oral   Take 1 tablet by mouth daily.          . fish oil-omega-3 fatty acids 1000 MG capsule   Oral   Take 1 g by mouth daily.         . multivitamin (THERAGRAN) per tablet   Oral   Take 1 tablet by mouth daily.           . simvastatin (ZOCOR) 40 MG tablet   Oral   Take 40 mg by mouth daily.           . nitroGLYCERIN (NITROSTAT) 0.4 MG SL tablet   Sublingual   Place 1 tablet (0.4 mg total) under the tongue every 5 (five) minutes as needed.   25 tablet   12   . pantoprazole (PROTONIX) 40 MG tablet   Oral   Take 1 tablet (40 mg total) by mouth 2 (two) times daily.   60 tablet   1    BP 152/79  Pulse 84  Temp(Src) 97.7 F (36.5 C) (Oral)  Resp 18  Ht 6\' 1"  (1.854 m)  Wt 191 lb 5.8 oz (86.8 kg)  BMI 25.25 kg/m2  SpO2 97% Physical Exam  Nursing note and vitals reviewed. Constitutional:  Awake, alert, nontoxic appearance.  HENT:  Head: Atraumatic.  Eyes: Right eye exhibits no discharge. Left eye exhibits no discharge.  Neck: Neck supple.  Cardiovascular: Normal rate and regular rhythm.   No murmur heard. Pulmonary/Chest: Effort normal and  breath sounds normal. No respiratory distress. He has no wheezes. He has no rales. He exhibits no tenderness.  Abdominal: Soft. Bowel sounds are normal. He exhibits no distension and no mass. There is no tenderness. There is no rebound and no guarding.  Genitourinary:  Stool sample noted in commode appears melanic  Musculoskeletal: He exhibits no edema and no tenderness.  Baseline ROM, no obvious new focal weakness.  Neurological: He is alert.  Mental status and motor strength appears baseline for patient and situation.  Skin: No rash noted.  Psychiatric: He has a normal mood and affect.    ED Course  Procedures (including critical care time) D/w GI and [facility] (unassigned) will admit. 4782 Patient / Family / Caregiver informed of clinical course, understand medical decision-making process, and agree with plan. Labs Review Labs Reviewed  CBC WITH DIFFERENTIAL -  Abnormal; Notable for the following:    WBC 13.3 (*)    RBC 3.95 (*)    Hemoglobin 12.2 (*)    HCT 34.9 (*)    Neutro Abs 7.9 (*)    All other components within normal limits  COMPREHENSIVE METABOLIC PANEL - Abnormal; Notable for the following:    Glucose, Bld 153 (*)    BUN 48 (*)    GFR calc non Af Amer 80 (*)    All other components within normal limits  HEMOGLOBIN AND HEMATOCRIT, BLOOD - Abnormal; Notable for the following:    Hemoglobin 9.5 (*)    HCT 27.2 (*)    All other components within normal limits  CBC - Abnormal; Notable for the following:    RBC 3.31 (*)    Hemoglobin 10.0 (*)    HCT 28.9 (*)    All other components within normal limits  BASIC METABOLIC PANEL - Abnormal; Notable for the following:    Glucose, Bld 108 (*)    Calcium 8.3 (*)    GFR calc non Af Amer 82 (*)    All other components within normal limits  CBC - Abnormal; Notable for the following:    RBC 2.82 (*)    Hemoglobin 8.7 (*)    HCT 24.7 (*)    All other components within normal limits  CBC - Abnormal; Notable for the following:    RBC 3.06 (*)    Hemoglobin 9.4 (*)    HCT 26.9 (*)    All other components within normal limits  CBC - Abnormal; Notable for the following:    RBC 2.72 (*)    Hemoglobin 8.3 (*)    HCT 24.0 (*)    All other components within normal limits  CBC - Abnormal; Notable for the following:    RBC 3.17 (*)    Hemoglobin 9.6 (*)    HCT 28.0 (*)    All other components within normal limits  CBC - Abnormal; Notable for the following:    RBC 2.81 (*)    Hemoglobin 8.6 (*)    HCT 24.6 (*)    All other components within normal limits  BASIC METABOLIC PANEL - Abnormal; Notable for the following:    Glucose, Bld 108 (*)    GFR calc non Af Amer 82 (*)    All other components within normal limits  OCCULT BLOOD, POC DEVICE - Abnormal; Notable for the following:    Fecal Occult Bld POSITIVE (*)    All other components within normal limits  MRSA PCR SCREENING  LIPASE,  BLOOD  H. PYLORI ANTIBODY, IGG  POCT I-STAT TROPONIN I  TYPE AND SCREEN  ABO/RH   Imaging Review No results found.  EKG Interpretation     Ventricular Rate:  102 PR Interval:  181 QRS Duration: 89 QT Interval:  334 QTC Calculation: 435 R Axis:   64 Text Interpretation:  Age not entered, assumed to be  79 years old for purpose of ECG interpretation Sinus tachycardia Abnormal R-wave progression, early transition Baseline wander in lead(s) I III aVL No significant change since last tracing            MDM   1. Upper GI bleed    The patient appears reasonably stabilized for admission considering the current resources, flow, and capabilities available in the ED at this time, and I doubt any other [facility] requiring further screening and/or treatment in the ED prior to admission.    Hurman Horn, MD 08/22/13 2126

## 2013-08-06 NOTE — Consult Note (Signed)
Subjective:   HPI  Patient is a 79 year old male who started to experience melena 2 days ago. He presented to the emergency room today and was admitted to the hospital. He denied abdominal pain or hematemesis. He gives no history of peptic ulcer disease. He is not on any blood thinners. He has not been taking any NSAIDs.  Review of Systems Denies chest pain or shortness of breath at this time  Past Medical History  Diagnosis Date  . Chest pain, unspecified     exertional  . Coronary atherosclerosis of unspecified type of vessel, native or graft     unspecified site, stent put in   . Other and unspecified hyperlipidemia   . Elevated prostate specific antigen (PSA)   . Special screening for malignant neoplasm of prostate    Past Surgical History  Procedure Laterality Date  . Tonsillectomy    . Coronary stent placement      CAD  . Tonsillectomy     History   Social History  . Marital Status: Married    Spouse Name: N/A    Number of Children: N/A  . Years of Education: N/A   Occupational History  . retired      Optician, dispensing    Social History Main Topics  . Smoking status: Former Games developer  . Smokeless tobacco: Not on file     Comment: quit in [phone]  . Alcohol Use: No  . Drug Use: No  . Sexual Activity: Not on file   Other Topics Concern  . Not on file   Social History Narrative   Married. Walks for exercise- really enjoys it.    family history includes Stroke (age of onset: 40) in his father; Stroke (age of onset: 70) in his mother. Current facility-administered medications:0.9 %  sodium chloride infusion, , Intravenous, Continuous, Hurman Horn, MD, Last Rate: 125 mL/hr at 08/06/13 0647;  0.9 %  sodium chloride infusion, , Intravenous, STAT, Hurman Horn, MD, Last Rate: 125 mL/hr at 08/06/13 0630 pantoprazole (PROTONIX) 80 mg in sodium chloride 0.9 % 250 mL infusion, 8 mg/hr, Intravenous, Continuous, Hillary Bow, DO, Last Rate: 25 mL/hr at 08/06/13 0900, 8  mg/hr at 08/06/13 0900;  [START ON 08/09/2013] pantoprazole (PROTONIX) injection 40 mg, 40 mg, Intravenous, Q12H, Hillary Bow, DO;  simvastatin (ZOCOR) tablet 40 mg, 40 mg, Oral, Daily, Hillary Bow, DO sodium chloride 0.9 % injection 3 mL, 3 mL, Intravenous, Q12H, Hillary Bow, DO Allergies  Allergen Reactions  . Aspirin     REACTION: blood in urine     Objective:     BP 136/71  Pulse 84  Temp(Src) 98.5 F (36.9 C) (Oral)  Resp 18  Ht 6\' 1"  (1.854 m)  Wt 89.1 kg (196 lb 6.9 oz)  BMI 25.92 kg/m2  SpO2 99%  He is in no distress  Nonicteric  Heart regular rhythm no murmurs  Lungs clear  Abdomen: Bowel sounds normal, soft, nontender  Laboratory No components found with this basename: d1      Assessment:     GI bleed characterized by melena      Plan:     We will proceed with EGD today. Continue PPI therapy. Lab Results  Component Value Date   HGB 12.2* 08/06/2013   HGB 15.1 06/28/2009   HCT 34.9* 08/06/2013   HCT 44.8 06/28/2009   ALKPHOS 50 08/06/2013   ALKPHOS 62 06/28/2009   AST 16 08/06/2013   AST 27 06/28/2009   ALT  14 08/06/2013   ALT 19 06/28/2009   Lab Results  Component Value Date   HGB 12.2* 08/06/2013   HGB 15.1 06/28/2009   HCT 34.9* 08/06/2013   HCT 44.8 06/28/2009   ALKPHOS 50 08/06/2013   ALKPHOS 62 06/28/2009   AST 16 08/06/2013   AST 27 06/28/2009   ALT 14 08/06/2013   ALT 19 06/28/2009

## 2013-08-06 NOTE — Progress Notes (Signed)
Pt transferred from ED via steracher with RN. Pt able to ambulate to bed. Pt Ax4, VVS, and wife at bedside. Will continue to monitor.

## 2013-08-06 NOTE — Op Note (Signed)
[facility] [address] [place], [address]   ENDOSCOPY PROCEDURE REPORT  PATIENT: Chad, Sanford  MR#: [id number] BIRTHDATE: 06/02/1934 , 79  yrs. old GENDER: Male ENDOSCOPIST: Wandalee Ferdinand, MD REFERRED BY: PROCEDURE DATE:  08/06/2013 PROCEDURE:   EGD with epinephrine injection and Endo Clip of a duodenal ulcer ASA CLASS: 3 INDICATIONS: melena MEDICATIONS: fentanyl 62.5 mcg IV, Versed 3 mg IV TOPICAL ANESTHETIC: Cetacaine spray  DESCRIPTION OF PROCEDURE:   After the risks benefits and alternatives of the procedure were thoroughly explained, informed consent was obtained.  The Pentax Gastroscope [id number]  endoscope was introduced through the mouth and advanced to the second portion of the duodenum      , limited by Without limitations.   The instrument was slowly withdrawn as the mucosa was fully examined.      FINDINGS:  Esophagus: Normal  Stomach: Normal  Duodenum: Small duodenal bulb ulcer with a centralized flat reddish visible vessel. No active bleeding at the time of endoscopy. 2 cc of epinephrine injected around the ulcer. Endo Clip placed on ulcer with hopes of preventing further bleeding.  COMPLICATIONS:none  ENDOSCOPIC IMPRESSION:see above   RECOMMENDATIONS:PPI therapy, follow clinically. Watch for further signs of bleeding. Follow H&H.   REPEAT EXAM: as needed   _______________________________ Rosalie DoctorWandalee Ferdinand, MD 08/06/2013 10:34 AM

## 2013-08-06 NOTE — ED Notes (Signed)
Per Patient: States she has been passing dark tarry stools since Wednesday. Pt reports he looses a large amount of black looking blood each time he has a bowel movement. Pt denies abdominal pain. Ax4, NAD.

## 2013-08-06 NOTE — Progress Notes (Signed)
[facility] Progress Note [name]   Chad Sanford NWG:[id number] DOB: 01-05-1934 DOA: 08/06/2013 PCP: Raliegh Ip, MD  Admit HPI / Brief Narrative: 79 y.o. male who presented to the ED with melena. Symptoms onset ~36hrs prior to admit, and patient stated that the amount of blood loss in his stool had been increasing each time he had a BM. No abdominal pain.  Mild nausea but no vomiting. Had a couple of melanotic BMs in the ED as well. HGB 12. Initially tachycardic at 109.  Noted to drop pressures into the 90s for a while but came back up into the 120s with fluids.  Assessment/Plan:  Acute UGIB - melena  Acute blood loss anemia  Hypovolemic shock   CAD w/ hx of stenting ~2007 cannot take aspirin because of bleeding from the bladder  HLD  Code Status: FULL Family Communication: no family present at time of exam Disposition Plan: SDU over night   Consultants: GI  Procedures: EGD - 10/17 - small duodenal bulb ulcer with a centralized flat reddish visible vessel - no active bleeding at the time of endoscopy - epinephrine injected - Endo Clip placed   Antibiotics: none  DVT prophylaxis: SCDs  HPI/Subjective: Pt seen for f/u visit.  Objective: Blood pressure 96/53, pulse 80, temperature 97.7 F (36.5 C), temperature source Axillary, resp. rate 13, height 6\' 1"  (1.854 m), weight 89.1 kg (196 lb 6.9 oz), SpO2 98.00%.  Intake/Output Summary (Last 24 hours) at 08/06/13 1618 Last data filed at 08/06/13 1400  Gross per 24 hour  Intake   1425 ml  Output      0 ml  Net   1425 ml   Exam: F/U exam completed  Data Reviewed: Basic Metabolic Panel:  Recent Labs Lab 08/06/13 0417  NA 141  K 3.6  CL 103  CO2 27  GLUCOSE 153*  BUN 48*  CREATININE 0.89  CALCIUM 8.5   Liver Function Tests:  Recent Labs Lab 08/06/13 0417  AST 16  ALT 14  ALKPHOS 50  BILITOT 0.6  PROT 6.3  ALBUMIN 3.6    Recent Labs Lab  08/06/13 0417  LIPASE 29   CBC:  Recent Labs Lab 08/06/13 0417 08/06/13 1145  WBC 13.3*  --   NEUTROABS 7.9*  --   HGB 12.2* 9.5*  HCT 34.9* 27.2*  MCV 88.4  --   PLT 315  --     Recent Results (from the past 240 hour(s))  MRSA PCR SCREENING     Status: None   Collection Time    08/06/13  6:42 AM      Result Value Range Status   MRSA by PCR NEGATIVE  NEGATIVE Final   Comment:            The GeneXpert MRSA Assay (FDA     approved for NASAL specimens     only), is one component of a     comprehensive MRSA colonization     surveillance program. It is not     intended to diagnose MRSA     infection nor to guide or     monitor treatment for     MRSA infections.     Studies:  Recent x-ray studies have been reviewed in detail by the Attending Physician  Scheduled Meds:  Scheduled Meds: . sodium chloride   Intravenous STAT  . [START ON 08/09/2013] pantoprazole (PROTONIX) IV  40 mg Intravenous Q12H  . simvastatin  40 mg Oral  Daily  . sodium chloride  3 mL Intravenous Q12H    Time spent on care of this patient: 35 mins   [facility] T  [facility] Office  [phone] Pager - Text Page per Loretha Stapler as per below:  On-Call/Text Page:      [email or web address]      password [email or web address]  If 7PM-7AM, please contact night-coverage [email or web address] Password [email or web address] 08/06/2013, 4:18 PM   LOS: 0 days

## 2013-08-06 NOTE — Progress Notes (Signed)
Utilization review completed.  

## 2013-08-07 LAB — CBC
Hemoglobin: 9.4 g/dL — ABNORMAL LOW (ref 13.0–17.0)
MCHC: 34.9 g/dL (ref 30.0–36.0)
MCV: 87.9 fL (ref 78.0–100.0)
Platelets: 193 10*3/uL (ref 150–400)
Platelets: 236 10*3/uL (ref 150–400)
RBC: 3.06 MIL/uL — ABNORMAL LOW (ref 4.22–5.81)
RDW: 13.4 % (ref 11.5–15.5)
RDW: 13.6 % (ref 11.5–15.5)
WBC: 5.1 10*3/uL (ref 4.0–10.5)
WBC: 6.2 10*3/uL (ref 4.0–10.5)

## 2013-08-07 LAB — BASIC METABOLIC PANEL
Calcium: 8.3 mg/dL — ABNORMAL LOW (ref 8.4–10.5)
Chloride: 108 mEq/L (ref 96–112)
Creatinine, Ser: 0.86 mg/dL (ref 0.50–1.35)
GFR calc Af Amer: >90 mL/min (ref >90)
GFR calc non Af Amer: 82 mL/min — ABNORMAL LOW (ref >90)

## 2013-08-07 MED ORDER — OMEGA-3 FATTY ACIDS 1000 MG PO CAPS: 1.0000 g | ORAL_CAPSULE | Freq: Every day | ORAL | Status: DC

## 2013-08-07 MED ORDER — OMEGA-3-ACID ETHYL ESTERS 1 G PO CAPS
1.0000 g | ORAL_CAPSULE | Freq: Every day | ORAL | Status: DC
  Administered 2013-08-07 – 2013-08-09 (×3): 1 g via ORAL
  Filled 2013-08-07 (×3): qty 1

## 2013-08-07 NOTE — Progress Notes (Signed)
Patient ID: JOSEPHMICHAEL LISENBEE, male   DOB: 04/22/1934, 79 y.o.   MRN: [id number] [facility] Gastroenterology Progress Note  CAILEN MIHALIK 79 y.o. 02-21-1934   Subjective: Sitting in chair with family in room. No BMs overnight. Denies abdominal pain. Anxious to go to floor bed and home when he can.  Objective: Vital signs in last 24 hours: Filed Vitals:   08/07/13 1200  BP: 109/63  Pulse: 77  Temp: 97.5 F (36.4 C)  Resp: 22    Physical Exam: Gen: alert, no acute distress Abd: soft, nontender, nondistended.  Lab Results:  Recent Labs  08/06/13 0417 08/07/13 0600  NA 141 140  K 3.6 3.6  CL 103 108  CO2 27 25  GLUCOSE 153* 108*  BUN 48* 18  CREATININE 0.89 0.86  CALCIUM 8.5 8.3*    Recent Labs  08/06/13 0417  AST 16  ALT 14  ALKPHOS 50  BILITOT 0.6  PROT 6.3  ALBUMIN 3.6    Recent Labs  08/06/13 0417  08/06/13 1745 08/07/13 0600  WBC 13.3*  --  6.6 5.1  NEUTROABS 7.9*  --   --   --   HGB 12.2*  < > 10.0* 8.7*  HCT 34.9*  < > 28.9* 24.7*  MCV 88.4  --  87.3 87.6  PLT 315  --  243 193  < > = values in this interval not displayed. No results found for this basename: LABPROT, INR,  in the last 72 hours    Assessment/Plan: S/P Duodenal ulcer bleed - s/p epi and hemoclip placement by Dr. Evette Cristal. Stable. No further bleeding. Suspect Hgb drop to 8.7 is dilutional. Will change to full liquids and if stable advance further tomorrow. Continue Protonix drip and will need Protonix PO BID for 3 months at discharge. Will check H. Pylori serology. Ok to transfer to floor bed today and will defer timing to [facility]. If continues to be stable then possible d/c Monday.   Arilynn Blakeney C. 08/07/2013, 12:45 PM

## 2013-08-07 NOTE — Progress Notes (Signed)
Pt to TX to 5W-14, VSS, report given: will make family aware

## 2013-08-07 NOTE — Progress Notes (Signed)
Wrapped IV site for patient to shower per request

## 2013-08-07 NOTE — Progress Notes (Signed)
[facility] Progress Note [place] TEAM 1 - Stepdown/ICU TEAM   Chad Sanford AVW:[id number] DOB: 01-22-1934 DOA: 08/06/2013 PCP: Raliegh Ip, MD  Admit HPI / Brief Narrative: 79 y.o. male who presented to the ED with melena. Symptoms onset ~36hrs prior to admit, and patient stated that the amount of blood loss in his stool had been increasing each time he had a BM. No abdominal pain.  Mild nausea but no vomiting. Had a couple of melanotic BMs in the ED as well. HGB 12. Initially tachycardic at 109.  Noted to drop pressures into the 90s for a while but came back up into the 120s with fluids.  Assessment/Plan:  Acute UGIB - melena >> Duodenal ulcer bleed  s/p epi and hemoclip placement by Dr. Evette Cristal - continue Protonix drip and will need Protonix PO BID for 3 months at discharge - check H. Pylori serology  Acute blood loss anemia Hgb drifting down, but agree is likely dilutional - will cont serial CBC   Hypovolemic shock  Resolved w/ volume resuscitation  CAD w/ hx of stenting ~2007 cannot take aspirin because of bleeding from the bladder - no symptoms   HLD  Code Status: FULL Family Communication: Spoke with patient and wife at bedside Disposition Plan: Transfer to a medical bed - possible discharge home 10/19 if hemoglobin stable and tolerates diet  Consultants: GI  Procedures: EGD - 10/17 - small duodenal bulb ulcer with a centralized flat reddish visible vessel - no active bleeding at the time of endoscopy - epinephrine injected - Endo Clip placed   Antibiotics: none  DVT prophylaxis: SCDs  HPI/Subjective: The patient is doing quite well.  He has no complaints whatsoever.  He reports "I can go jogging right now."  He has not had a bowel movement overnight.  He denies abdominal cramping dyspepsia nausea or vomiting.  He denies chest pain.  Objective: Blood pressure 109/63, pulse 77, temperature 97.5 F (36.4 C), temperature source Oral, resp. rate 22,  height 6\' 1"  (1.854 m), weight 89.1 kg (196 lb 6.9 oz), SpO2 100.00%.  Intake/Output Summary (Last 24 hours) at 08/07/13 1353 Last data filed at 08/07/13 1200  Gross per 24 hour  Intake 2462.92 ml  Output      0 ml  Net 2462.92 ml   Exam: General: No acute respiratory distress Lungs: Clear to auscultation bilaterally without wheezes or crackles Cardiovascular: Regular rate and rhythm without murmur gallop or rub normal S1 and S2 Abdomen: Nontender, nondistended, soft, bowel sounds positive, no rebound, no ascites, no appreciable mass Extremities: No significant cyanosis, clubbing, or edema bilateral lower extremities   Data Reviewed: Basic Metabolic Panel:  Recent Labs Lab 08/06/13 0417 08/07/13 0600  NA 141 140  K 3.6 3.6  CL 103 108  CO2 27 25  GLUCOSE 153* 108*  BUN 48* 18  CREATININE 0.89 0.86  CALCIUM 8.5 8.3*   Liver Function Tests:  Recent Labs Lab 08/06/13 0417  AST 16  ALT 14  ALKPHOS 50  BILITOT 0.6  PROT 6.3  ALBUMIN 3.6    Recent Labs Lab 08/06/13 0417  LIPASE 29   CBC:  Recent Labs Lab 08/06/13 0417 08/06/13 1145 08/06/13 1745 08/07/13 0600  WBC 13.3*  --  6.6 5.1  NEUTROABS 7.9*  --   --   --   HGB 12.2* 9.5* 10.0* 8.7*  HCT 34.9* 27.2* 28.9* 24.7*  MCV 88.4  --  87.3 87.6  PLT 315  --  243 193  Recent Results (from the past 240 hour(s))  MRSA PCR SCREENING     Status: None   Collection Time    08/06/13  6:42 AM      Result Value Range Status   MRSA by PCR NEGATIVE  NEGATIVE Final   Comment:            The GeneXpert MRSA Assay (FDA     approved for NASAL specimens     only), is one component of a     comprehensive MRSA colonization     surveillance program. It is not     intended to diagnose MRSA     infection nor to guide or     monitor treatment for     MRSA infections.     Studies:  Recent x-ray studies have been reviewed in detail by the Attending Physician  Scheduled Meds:  Scheduled Meds: . [START ON  08/09/2013] pantoprazole (PROTONIX) IV  40 mg Intravenous Q12H  . simvastatin  40 mg Oral Daily  . sodium chloride  3 mL Intravenous Q12H    Time spent on care of this patient: 35 mins   [facility] T  [facility] Office  [phone] Pager - Text Page per Loretha Stapler as per below:  On-Call/Text Page:      [email or web address]      password [email or web address]  If 7PM-7AM, please contact night-coverage [email or web address] Password [email or web address] 08/07/2013, 1:53 PM   LOS: 1 day

## 2013-08-07 NOTE — Progress Notes (Signed)
08/07/13 Patient transferred from 3S to 5W14 at 1500 accompanied by wife. Alert and oriented IV site LFA dated 08/06/13. Continent of bowels and bladder.

## 2013-08-08 LAB — CBC
HCT: 24 % — ABNORMAL LOW (ref 39.0–52.0)
Hemoglobin: 8.3 g/dL — ABNORMAL LOW (ref 13.0–17.0)
Hemoglobin: 9.6 g/dL — ABNORMAL LOW (ref 13.0–17.0)
MCH: 30.3 pg (ref 26.0–34.0)
RBC: 3.17 MIL/uL — ABNORMAL LOW (ref 4.22–5.81)
RDW: 13.6 % (ref 11.5–15.5)
WBC: 5.2 10*3/uL (ref 4.0–10.5)
WBC: 6.7 10*3/uL (ref 4.0–10.5)

## 2013-08-08 MED ORDER — ZOLPIDEM TARTRATE 5 MG PO TABS
5.0000 mg | ORAL_TABLET | Freq: Once | ORAL | Status: AC
  Administered 2013-08-08: 23:00:00 5 mg via ORAL
  Filled 2013-08-08: qty 1

## 2013-08-08 MED ORDER — PANTOPRAZOLE SODIUM 40 MG PO TBEC
40.0000 mg | DELAYED_RELEASE_TABLET | Freq: Two times a day (BID) | ORAL | Status: DC
  Administered 2013-08-08 – 2013-08-09 (×2): 40 mg via ORAL
  Filled 2013-08-08: qty 1

## 2013-08-08 NOTE — Progress Notes (Signed)
Uneventful night. Patient slept with no distress noted. Patient states he is ready to go home.

## 2013-08-08 NOTE — Progress Notes (Signed)
Patient ID: Chad Sanford, male   DOB: August 19, 1934, 79 y.o.   MRN: [id number]   Sitting on side of bed with wife. Reports bloody stool last night. Hgb 8.3 (9.4). Tolerating diet.  S/P duodenal ulcer bleed - bloody stool last night likely residual blood. If stable tomorrow, then d/c tomorrow with f/u with Dr. Evette Cristal from GI in 2-3 weeks. Will need to be on a PPI PO BID at discharge. H. Pylori serology pending and will f/u as outpt. Will sign off. Call if questions.

## 2013-08-08 NOTE — Progress Notes (Signed)
[facility] Progress Note [place] TEAM 1 - Stepdown/ICU TEAM   Chad Sanford ZOX:[id number] DOB: November 26, 1933 DOA: 08/06/2013 PCP: Raliegh Ip, MD  Admit HPI / Brief Narrative: 79 y.o. male who presented to the ED with melena. Symptoms onset ~36hrs prior to admit, and patient stated that the amount of blood loss in his stool had been increasing each time he had a BM. No abdominal pain.  Mild nausea but no vomiting. Had a couple of melanotic BMs in the ED as well. HGB 12. Initially tachycardic at 109.  Noted to drop pressures into the 90s for a while but came back up into the 120s with fluids.  Assessment/Plan:  Acute UGIB - melena >> Duodenal ulcer bleed  s/p epi and hemoclip placement by Dr. Evette Cristal - transition to oral protonix today for planned 3 month tx course - checking H. Pylori serology  Acute blood loss anemia Hgb drifting down - will cont serial CBC until Hgb stable - pt is up and ambulatory w/o orthostatic sx  Hypovolemic shock  Resolved w/ volume resuscitation  CAD w/ hx of stenting ~2007 cannot take aspirin because of bleeding from the bladder - no symptoms   HLD  Code Status: FULL Family Communication: Spoke with patient and wife at bedside Disposition Plan: possible discharge home 10/20 if hemoglobin stable   Consultants: GI  Procedures: EGD - 10/17 - small duodenal bulb ulcer with a centralized flat reddish visible vessel - no active bleeding at the time of endoscopy - epinephrine injected - Endo Clip placed   Antibiotics: none  DVT prophylaxis: SCDs  HPI/Subjective: The patient is doing quite well.  He has no complaints whatsoever.  He denies abdominal cramping dyspepsia nausea or vomiting.  He denies chest pain.  Objective: Blood pressure 122/69, pulse 71, temperature 97.5 F (36.4 C), temperature source Oral, resp. rate 16, height 6\' 1"  (1.854 m), weight 86.8 kg (191 lb 5.8 oz), SpO2 97.00%.  Intake/Output Summary (Last 24 hours) at  08/08/13 1538 Last data filed at 08/08/13 0809  Gross per 24 hour  Intake   1408 ml  Output      0 ml  Net   1408 ml   Exam: General: No acute respiratory distress Lungs: Clear to auscultation bilaterally Cardiovascular: Regular rate and rhythm without murmur gallop or rub normal S1 and S2 Abdomen: Nontender, nondistended, soft, bowel sounds positive, no rebound, no ascites, no appreciable mass Extremities: No significant cyanosis, clubbing, or edema bilateral lower extremities   Data Reviewed: Basic Metabolic Panel:  Recent Labs Lab 08/06/13 0417 08/07/13 0600  NA 141 140  K 3.6 3.6  CL 103 108  CO2 27 25  GLUCOSE 153* 108*  BUN 48* 18  CREATININE 0.89 0.86  CALCIUM 8.5 8.3*   Liver Function Tests:  Recent Labs Lab 08/06/13 0417  AST 16  ALT 14  ALKPHOS 50  BILITOT 0.6  PROT 6.3  ALBUMIN 3.6    Recent Labs Lab 08/06/13 0417  LIPASE 29   CBC:  Recent Labs Lab 08/06/13 0417 08/06/13 1145 08/06/13 1745 08/07/13 0600 08/07/13 2019 08/08/13 0439  WBC 13.3*  --  6.6 5.1 6.2 5.2  NEUTROABS 7.9*  --   --   --   --   --   HGB 12.2* 9.5* 10.0* 8.7* 9.4* 8.3*  HCT 34.9* 27.2* 28.9* 24.7* 26.9* 24.0*  MCV 88.4  --  87.3 87.6 87.9 88.2  PLT 315  --  243 193 236 187    Recent  Results (from the past 240 hour(s))  MRSA PCR SCREENING     Status: None   Collection Time    08/06/13  6:42 AM      Result Value Range Status   MRSA by PCR NEGATIVE  NEGATIVE Final   Comment:            The GeneXpert MRSA Assay (FDA     approved for NASAL specimens     only), is one component of a     comprehensive MRSA colonization     surveillance program. It is not     intended to diagnose MRSA     infection nor to guide or     monitor treatment for     MRSA infections.     Studies:  Recent x-ray studies have been reviewed in detail by the Attending Physician  Scheduled Meds:  Scheduled Meds: . omega-3 acid ethyl esters  1 g Oral Daily  . [START ON 08/09/2013]  pantoprazole (PROTONIX) IV  40 mg Intravenous Q12H  . simvastatin  40 mg Oral Daily  . sodium chloride  3 mL Intravenous Q12H    Time spent on care of this patient: 35 mins   [facility] T  [facility] Office  [phone] Pager - Text Page per Loretha Stapler as per below:  On-Call/Text Page:      [email or web address]      password [email or web address]  If 7PM-7AM, please contact night-coverage [email or web address] Password [email or web address] 08/08/2013, 3:38 PM   LOS: 2 days

## 2013-08-08 NOTE — Progress Notes (Addendum)
Patient states anxiety over prognosis and hospital stay. Requests sleep medication. Verbalizes frustration with physicians not able to diagnose why blood count keeps dropping. Patient states does not feel sick.

## 2013-08-09 ENCOUNTER — Encounter (HOSPITAL_COMMUNITY): Payer: Self-pay | Admitting: Gastroenterology

## 2013-08-09 LAB — BASIC METABOLIC PANEL
BUN: 13 mg/dL (ref 6–23)
CO2: 25 mEq/L (ref 19–32)
Calcium: 8.6 mg/dL (ref 8.4–10.5)
Chloride: 105 mEq/L (ref 96–112)
Creatinine, Ser: 0.86 mg/dL (ref 0.50–1.35)
GFR calc non Af Amer: 82 mL/min — ABNORMAL LOW (ref >90)

## 2013-08-09 LAB — H. PYLORI ANTIBODY, IGG: H Pylori IgG: 0.44 {ISR}

## 2013-08-09 LAB — CBC
HCT: 24.6 % — ABNORMAL LOW (ref 39.0–52.0)
MCH: 30.6 pg (ref 26.0–34.0)
MCV: 87.5 fL (ref 78.0–100.0)
Platelets: 202 10*3/uL (ref 150–400)
RDW: 13.5 % (ref 11.5–15.5)
WBC: 5.7 10*3/uL (ref 4.0–10.5)

## 2013-08-09 MED ORDER — PANTOPRAZOLE SODIUM 40 MG PO TBEC: 40.0000 mg | DELAYED_RELEASE_TABLET | Freq: Two times a day (BID) | ORAL | Status: DC

## 2013-08-09 NOTE — Care Management Note (Signed)
    Page 1 of 1   08/09/2013     5:48:53 PM   CARE MANAGEMENT NOTE 08/09/2013  Patient:  Chad Sanford, Chad Sanford   Account Number:  [id number]  Date Initiated:  08/09/2013  Documentation initiated by:  Letha Cape  Subjective/Objective Assessment:   dx ugi bleed  admit- lives with spouse.  pta indep.     Action/Plan:   Anticipated DC Date:  08/09/2013   Anticipated DC Plan:  HOME/SELF CARE      DC Planning Services  CM consult      Choice offered to / List presented to:             Status of service:  Completed, signed off Medicare Important Message given?   (If response is "NO", the following Medicare IM given date fields will be blank) Date Medicare IM given:   Date Additional Medicare IM given:    Discharge Disposition:  HOME/SELF CARE  Per UR Regulation:  Reviewed for med. necessity/level of care/duration of stay  If discussed at Long Length of Stay Meetings, dates discussed:    Comments:

## 2013-08-09 NOTE — Progress Notes (Signed)
08/09/13 Patient being discharged home with wife. IV site removed, discharge instructions reviewed with patient.

## 2013-08-09 NOTE — Discharge Summary (Signed)
DISCHARGE SUMMARY  Chad Sanford  MR#: [id number]  DOB:[birth year]  Date of Admission: 08/06/2013 Date of Discharge: 08/09/2013  Attending Physician:Baylen Dea T  Patient's Chad Sanford:Chad Sanford,Chad W, MD  Consults: GI - Graylin Shiver, MD  Disposition: D/C Home   Follow-up Appts:     Follow-up Information   Follow up with Chad Ip, MD. Schedule an appointment as soon as possible for a visit in 7 days.   Specialty:  Family Medicine   Contact information:   [address] [place] [address] [phone]       Follow up with Graylin Shiver, MD. Schedule an appointment as soon as possible for a visit in 2 weeks.   Specialty:  Gastroenterology   Contact information:   [address], [address] [place] [address] [phone]      Tests Needing Follow-up: - H pylori screen is pending at time of d/c  - F/U CBC is suggested in 5-7 days   Discharge Diagnoses: Acute UGIB - melena >> Duodenal ulcer bleed  Acute blood loss anemia  Hypovolemic shock  CAD Sanford/ hx of stenting ~2007  HLD  Initial presentation: 79 y.o. male who presented to the ED with melena. Symptoms onset ~36hrs prior to admit, and patient stated that the amount of blood loss in his stool had been increasing each time he had a BM. No abdominal pain. Mild nausea but no vomiting. Had a couple of melanotic BMs in the ED as well. HGB 12 at presentation. Initially tachycardic at 109. Noted to drop pressures into the 90s for a while but came back up into the 120s with fluids.   Hospital Course:  Acute UGIB - melena >> Duodenal ulcer bleed  s/p epi and hemoclip placement by Dr. Evette Cristal - transitioned to oral protonix for planned 3 month tx course - H. Pylori serology pending at time of d/c - cleared for d/c by GI - to f/u Sanford/ Dr. Evette Cristal in 2-3 weeks - no evidence of ongoing blood loss at time of d/c - tolerating regular diet Sanford/o difficulty  Acute blood loss anemia  pt up and ambulatory Sanford/o orthostatic sx -  Hgb has stabilized around 8.3 - 8.6 with no evidence of ongoing bleeding - pt counseled to seek immediate medical attention should he develop hematemesis or orthostatic symptoms    Hypovolemic shock due to hemorrhage Resolved Sanford/ volume resuscitation   CAD Sanford/ hx of stenting ~2007  cannot take aspirin because of bleeding from the bladder - no symptoms   HLD Resumed home tx regimen    Medication List         fish oil-omega-3 fatty acids 1000 MG capsule  Take 1 g by mouth daily.     multivitamin per tablet  Take 1 tablet by mouth daily.     nitroGLYCERIN 0.4 MG SL tablet  Commonly known as:  NITROSTAT  Place 1 tablet (0.4 mg total) under the tongue every 5 (five) minutes as needed.     pantoprazole 40 MG tablet  Commonly known as:  PROTONIX  Take 1 tablet (40 mg total) by mouth 2 (two) times daily.     simvastatin 40 MG tablet  Commonly known as:  ZOCOR  Take 40 mg by mouth daily.     VITAMIN B 12 PO  Take 1 tablet by mouth daily.       Day of Discharge BP 152/79  Pulse 84  Temp(Src) 97.7 F (36.5 C) (Oral)  Resp 18  Ht 6\' 1"  (1.854  m)  Wt 86.8 kg (191 lb 5.8 oz)  BMI 25.25 kg/m2  SpO2 97%  Physical Exam: General: No acute respiratory distress Lungs: Clear to auscultation bilaterally without wheezes or crackles Cardiovascular: Regular rate and rhythm without murmur gallop or rub normal S1 and S2 Abdomen: Nontender, nondistended, soft, bowel sounds positive, no rebound, no ascites, no appreciable mass Extremities: No significant cyanosis, clubbing, or edema bilateral lower extremities  Results for orders placed during the hospital encounter of 08/06/13 (from the past 24 hour(s))  CBC     Status: Abnormal   Collection Time    08/08/13  7:00 PM      Result Value Range   WBC 6.7  4.0 - 10.5 K/uL   RBC 3.17 (*) 4.22 - 5.81 MIL/uL   Hemoglobin 9.6 (*) 13.0 - 17.0 g/dL   HCT 16.1 (*) 09.6 - 04.5 %   MCV 88.3  78.0 - 100.0 fL   MCH 30.3  26.0 - 34.0 pg   MCHC  34.3  30.0 - 36.0 g/dL   RDW 40.9  81.1 - 91.4 %   Platelets 253  150 - 400 K/uL  CBC     Status: Abnormal   Collection Time    08/09/13  4:40 AM      Result Value Range   WBC 5.7  4.0 - 10.5 K/uL   RBC 2.81 (*) 4.22 - 5.81 MIL/uL   Hemoglobin 8.6 (*) 13.0 - 17.0 g/dL   HCT 78.2 (*) 95.6 - 21.3 %   MCV 87.5  78.0 - 100.0 fL   MCH 30.6  26.0 - 34.0 pg   MCHC 35.0  30.0 - 36.0 g/dL   RDW 08.6  57.8 - 46.9 %   Platelets 202  150 - 400 K/uL  BASIC METABOLIC PANEL     Status: Abnormal   Collection Time    08/09/13  4:40 AM      Result Value Range   Sodium 140  135 - 145 mEq/L   Potassium 3.6  3.5 - 5.1 mEq/L   Chloride 105  96 - 112 mEq/L   CO2 25  19 - 32 mEq/L   Glucose, Bld 108 (*) 70 - 99 mg/dL   BUN 13  6 - 23 mg/dL   Creatinine, Ser 6.29  0.50 - 1.35 mg/dL   Calcium 8.6  8.4 - 52.8 mg/dL   GFR calc non Af Amer 82 (*) >90 mL/min   GFR calc Af Amer >90  >90 mL/min    Time spent in discharge (includes decision making & examination of pt): >30 minutes  08/09/2013, 11:17 AM   Lonia Blood, MD [facility] Office  [phone] Pager [phone]  On-Call/Text Page:      [email or web address]      password [facility]

## 2014-02-16 ENCOUNTER — Ambulatory Visit (INDEPENDENT_AMBULATORY_CARE_PROVIDER_SITE_OTHER): Payer: Commercial Managed Care - HMO | Admitting: Cardiovascular Disease

## 2014-02-16 ENCOUNTER — Encounter: Payer: Self-pay | Admitting: Cardiovascular Disease

## 2014-02-16 VITALS — BP 150/98 | HR 70 | Ht 73.0 in | Wt 199.0 lb

## 2014-02-16 DIAGNOSIS — E785 Hyperlipidemia, unspecified: Secondary | ICD-10-CM

## 2014-02-16 DIAGNOSIS — I2510 Atherosclerotic heart disease of native coronary artery without angina pectoris: Secondary | ICD-10-CM

## 2014-02-16 DIAGNOSIS — I10 Essential (primary) hypertension: Secondary | ICD-10-CM

## 2014-02-16 MED ORDER — ATENOLOL 25 MG PO TABS: 25.0000 mg | ORAL_TABLET | Freq: Every day | ORAL | Status: DC

## 2014-02-16 NOTE — Patient Instructions (Signed)
Your physician recommends that you schedule a follow-up appointment in: NEXT AVAILABLE  3-4  WEEKS  WITH  DR [facility]  Your physician has recommended you make the following change in your medication:  ADD ASPIRIN 325 MG  EVERY OTHER  DAY  AND  ATENOLOL  25  MG EVERY EVENING

## 2014-02-16 NOTE — Assessment & Plan Note (Signed)
Cholesterol is at goal.  Continue current dose of statin and diet Rx.  No myalgias or side effects.  F/U  LFT's in 6 months. Lab Results  Component Value Date   LDLCALC 84 06/28/2009  Labs with Dr Philips reviewed TC 174 LDL under 70

## 2014-02-16 NOTE — Assessment & Plan Note (Signed)
Stable with no angina and good activity level.  Continue medical Rx Nitro called in Discussed starting 81 mg of ASA qod  If he has hematuria Will need reevaluation by urology

## 2014-02-16 NOTE — Assessment & Plan Note (Signed)
Start atenolol  Not clear if this is related to his pulsatile tinnitus  May help  Monitor at home  Start flonase to help equalize eustachian tube pressure

## 2014-02-16 NOTE — Progress Notes (Signed)
Patient ID: Chad Sanford, male   DOB: 27-May-1934, 80 y.o.   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  Previously seen by Dr Verl Blalock  F/U CAD  New onset angina 2007 with stenting of proximal Lad by Dr Olevia Perches   CONCLUSION:  1. Coronary artery disease with 40% proximal and 95% proximal stenosis in  the left anterior descending coronary artery, with 40% stenosis in the  mid left anterior descending coronary artery and 50% stenosis in the  first large diagonal branch; 40% narrowing in the distal circumflex  artery; 30% mid and 30% distal stenosis in the right coronary artery;  and normal left ventricular function.  2. Successful percutaneous coronary intervention of the lesion in the  proximal left anterior descending coronary artery using a Cypher drug-  eluting stent with improvement in percent of narrowing from 95 to 0%.  He has not been on any antiplatelet agent in years and indicated that Chad Sanford was ok with this Had hematuria over 5 years ago with negative cystoscopy by Jeffie Pollock Has pulsatile tinnitus over the last 6 months worse in right ear.  Seen by Dr Wilburn Cornelia Reviewed notes and no specific Rx reccommended  Has severe high frequency Sensorineural hearing loss with amplification Rx.  No chest pain has not had any hematuria or bleeding issues.     ROS: Denies fever, malais, weight loss, blurry vision, decreased visual acuity, cough, sputum, SOB, hemoptysis, pleuritic pain, palpitaitons, heartburn, abdominal pain, melena, lower extremity edema, claudication, or rash.  All other systems reviewed and negative   General: Affect appropriate Healthy:  appears stated age 80: normal Neck supple with no adenopathy JVP normal no bruits no thyromegaly Lungs clear with no wheezing and good diaphragmatic motion Heart:  S1/S2 no murmur,rub, gallop or click PMI normal Abdomen: benighn, BS positve, no tenderness, no AAA no bruit.  No HSM or HJR Distal pulses intact with no bruits No edema Neuro  non-focal Skin warm and dry No muscular weakness  Medications Current Outpatient Prescriptions  Medication Sig Dispense Refill  . Cyanocobalamin (VITAMIN B 12 PO) Take 1 tablet by mouth daily.      . fish oil-omega-3 fatty acids 1000 MG capsule Take 1 g by mouth daily.      . Ibuprofen 200 MG CAPS       . multivitamin (THERAGRAN) per tablet Take 1 tablet by mouth daily.        . nitroGLYCERIN (NITROSTAT) 0.4 MG SL tablet Place 1 tablet (0.4 mg total) under the tongue every 5 (five) minutes as needed.  25 tablet  12  . simvastatin (ZOCOR) 40 MG tablet Take 40 mg by mouth daily.         No current facility-administered medications for this visit.    Allergies Aspirin  Family History: Family History  Problem Relation Age of Onset  . Stroke Mother 40  . Stroke Father 44    Social History: History   Social History  . Marital Status: Married    Spouse Name: N/A    Number of Children: N/A  . Years of Education: N/A   Occupational History  . retired      Company secretary    Social History Main Topics  . Smoking status: Former Research scientist (life sciences)  . Smokeless tobacco: Not on file     Comment: quit in [phone]  . Alcohol Use: No  . Drug Use: No  . Sexual Activity: Not on file   Other Topics Concern  . Not on file  Social History Narrative   Married. Walks for exercise- really enjoys it.     Electrocardiogram: Aug 07 2013 SR 102 low voltage   Assessment and Plan

## 2014-03-01 ENCOUNTER — Telehealth: Payer: Self-pay | Admitting: Cardiovascular Disease

## 2014-03-01 NOTE — Telephone Encounter (Signed)
New message     Patient calling blood pressure on yesterday  96/55  Heart rate 74 .   This am 152/88  Heart rate 64.   No chest pain, no sob. Only dry mouth.

## 2014-03-01 NOTE — Telephone Encounter (Signed)
SPOKE WITH PT IN GREAT  LENGTH  RE THE  LOW  B/P  READING  AS  STATED  IN MESSAGE HAS  CONCERNS REGARDING   THE   FLUCTUATION   TO  152/88  ALSO   COMPLAINED WITH  SOME  STOMACH ISSUES  THAT  DAY AS  WELL AS  EXERCISING  MAY HAVE  NOT  DRANK  ENOUGH  ENCOURAGED  PT  TO   CONTINUE TO MONITOR  B/P AND  BRING  READING S TO  03/11/14   APPT  VERBALIZED UNDERSTANDING    ENCOURAGED  TO CALL  IF   NOTES  CONSISTENTLY LOW  READINGS  BEFORE  THAT  APPT  ./CY WILL FORWARD TO DR Johnsie Cancel FOR REVIEW .Adonis Housekeeper

## 2014-03-11 ENCOUNTER — Encounter: Payer: Self-pay | Admitting: Cardiovascular Disease

## 2014-03-11 ENCOUNTER — Ambulatory Visit (INDEPENDENT_AMBULATORY_CARE_PROVIDER_SITE_OTHER): Payer: Medicare HMO | Admitting: Cardiovascular Disease

## 2014-03-11 VITALS — BP 148/81 | HR 57 | Ht 73.0 in | Wt 201.0 lb

## 2014-03-11 DIAGNOSIS — I2510 Atherosclerotic heart disease of native coronary artery without angina pectoris: Secondary | ICD-10-CM

## 2014-03-11 DIAGNOSIS — E785 Hyperlipidemia, unspecified: Secondary | ICD-10-CM

## 2014-03-11 NOTE — Assessment & Plan Note (Signed)
Cholesterol is at goal.  Continue current dose of statin and diet Rx.  No myalgias or side effects.  F/U  LFT's in 6 months. Lab Results  Component Value Date   LDLCALC 84 06/28/2009   Labs with primary

## 2014-03-11 NOTE — Progress Notes (Signed)
Patient ID: Chad Sanford, male   DOB: 05-19-1934, 80 y.o.   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 Previously seen by Dr Verl Blalock F/U CAD New onset angina 2007 with stenting of proximal Lad by Dr Olevia Perches  CONCLUSION:  1. Coronary artery disease with 40% proximal and 95% proximal stenosis in  the left anterior descending coronary artery, with 40% stenosis in the  mid left anterior descending coronary artery and 50% stenosis in the  first large diagonal branch; 40% narrowing in the distal circumflex  artery; 30% mid and 30% distal stenosis in the right coronary artery;  and normal left ventricular function.  2. Successful percutaneous coronary intervention of the lesion in the  proximal left anterior descending coronary artery using a Cypher drug-  eluting stent with improvement in percent of narrowing from 95 to 0%.  He has not been on any antiplatelet agent in years and indicated that TW was ok with this Had hematuria over 5 years ago with negative cystoscopy by Jeffie Pollock  Has pulsatile tinnitus over the last 6 months worse in right ear. Seen by Dr Wilburn Cornelia Reviewed notes and no specific Rx reccommended Has severe high frequency  Sensorineural hearing loss with amplification Rx. No chest pain has not had any hematuria or bleeding issues.   Doing better with beta blocker  BP runs ok at home even low at times Stress level seems better     ROS: Denies fever, malais, weight loss, blurry vision, decreased visual acuity, cough, sputum, SOB, hemoptysis, pleuritic pain, palpitaitons, heartburn, abdominal pain, melena, lower extremity edema, claudication, or rash.  All other systems reviewed and negative  General: Affect appropriate Healthy:  appears stated age 80: normal Neck supple with no adenopathy JVP normal no bruits no thyromegaly Lungs clear with no wheezing and good diaphragmatic motion Heart:  S1/S2 no murmur, no rub, gallop or click PMI normal Abdomen: benighn, BS positve, no tenderness,  no AAA no bruit.  No HSM or HJR Distal pulses intact with no bruits No edema Neuro non-focal Skin warm and dry No muscular weakness   Current Outpatient Prescriptions  Medication Sig Dispense Refill  . aspirin 325 MG tablet Take 325 mg by mouth every other day.      Marland Kitchen atenolol (TENORMIN) 25 MG tablet Take 1 tablet (25 mg total) by mouth at bedtime.  90 tablet  3  . Cyanocobalamin (VITAMIN B 12 PO) Take 1 tablet by mouth daily.      . fish oil-omega-3 fatty acids 1000 MG capsule Take 1 g by mouth daily.      . Ibuprofen 200 MG CAPS as needed.       . multivitamin (THERAGRAN) per tablet Take 1 tablet by mouth daily.        . nitroGLYCERIN (NITROSTAT) 0.4 MG SL tablet Place 1 tablet (0.4 mg total) under the tongue every 5 (five) minutes as needed.  25 tablet  12  . simvastatin (ZOCOR) 40 MG tablet Take 40 mg by mouth daily.         No current facility-administered medications for this visit.    Allergies  Aspirin  Electrocardiogram:  SR rate 102  Low voltage   Assessment and Plan

## 2014-03-11 NOTE — Patient Instructions (Signed)
Your physician recommends that you schedule a follow-up appointment in: NEXT  AVAILABLE  ( AUGUST ) WITH   DR Litzenberg [facility]  Your physician recommends that you continue on your current medications as directed. Please refer to the Current Medication list given to you today.

## 2014-03-11 NOTE — Assessment & Plan Note (Signed)
Stable with no angina and good activity level.  Continue medical Rx Taking baby aspirin daily no with no hematuria

## 2014-03-11 NOTE — Assessment & Plan Note (Signed)
Continue beta blocker  He will bring in BP cuff and readings next visit  Likely white coat component

## 2014-03-14 ENCOUNTER — Emergency Department (HOSPITAL_COMMUNITY)
Admission: EM | Admit: 2014-03-14 | Discharge: 2014-03-14 | Disposition: A | Discharge status: Home / Self Care | Payer: Medicare HMO | Attending: Emergency Medicine | Admitting: Emergency Medicine

## 2014-03-14 ENCOUNTER — Emergency Department (HOSPITAL_COMMUNITY): Payer: Medicare HMO

## 2014-03-14 ENCOUNTER — Encounter (HOSPITAL_COMMUNITY): Payer: Self-pay | Admitting: Emergency Medicine

## 2014-03-14 DIAGNOSIS — R5381 Other malaise: Secondary | ICD-10-CM | POA: Insufficient documentation

## 2014-03-14 DIAGNOSIS — R5383 Other fatigue: Secondary | ICD-10-CM

## 2014-03-14 DIAGNOSIS — E785 Hyperlipidemia, unspecified: Secondary | ICD-10-CM | POA: Insufficient documentation

## 2014-03-14 DIAGNOSIS — Z7982 Long term (current) use of aspirin: Secondary | ICD-10-CM | POA: Insufficient documentation

## 2014-03-14 DIAGNOSIS — R42 Dizziness and giddiness: Secondary | ICD-10-CM | POA: Insufficient documentation

## 2014-03-14 DIAGNOSIS — I10 Essential (primary) hypertension: Secondary | ICD-10-CM | POA: Insufficient documentation

## 2014-03-14 DIAGNOSIS — R079 Chest pain, unspecified: Secondary | ICD-10-CM | POA: Insufficient documentation

## 2014-03-14 DIAGNOSIS — Z9861 Coronary angioplasty status: Secondary | ICD-10-CM | POA: Insufficient documentation

## 2014-03-14 DIAGNOSIS — Z87891 Personal history of nicotine dependence: Secondary | ICD-10-CM | POA: Insufficient documentation

## 2014-03-14 DIAGNOSIS — I2510 Atherosclerotic heart disease of native coronary artery without angina pectoris: Secondary | ICD-10-CM | POA: Insufficient documentation

## 2014-03-14 DIAGNOSIS — Z79899 Other long term (current) drug therapy: Secondary | ICD-10-CM | POA: Insufficient documentation

## 2014-03-14 LAB — BASIC METABOLIC PANEL
BUN: 13 mg/dL (ref 6–23)
CO2: 29 mEq/L (ref 19–32)
Calcium: 9.3 mg/dL (ref 8.4–10.5)
Chloride: 103 mEq/L (ref 96–112)
Creatinine, Ser: 0.94 mg/dL (ref 0.50–1.35)
GFR calc Af Amer: >90 mL/min (ref >90)
GFR, EST NON AFRICAN AMERICAN: 78 mL/min — AB (ref >90)
Glucose, Bld: 112 mg/dL — ABNORMAL HIGH (ref 70–99)
Potassium: 3.9 mEq/L (ref 3.7–5.3)
SODIUM: 144 meq/L (ref 137–147)

## 2014-03-14 LAB — CBC
HCT: 45 % (ref 39.0–52.0)
Hemoglobin: 15.1 g/dL (ref 13.0–17.0)
MCH: 30.1 pg (ref 26.0–34.0)
MCHC: 33.6 g/dL (ref 30.0–36.0)
MCV: 89.8 fL (ref 78.0–100.0)
Platelets: 274 10*3/uL (ref 150–400)
RBC: 5.01 MIL/uL (ref 4.22–5.81)
RDW: 13.7 % (ref 11.5–15.5)
WBC: 7.2 10*3/uL (ref 4.0–10.5)

## 2014-03-14 LAB — I-STAT TROPONIN, ED: TROPONIN I, POC: 0.01 ng/mL (ref 0.00–0.08)

## 2014-03-14 NOTE — ED Notes (Addendum)
C/o BP 191/89 at home.  Pt states he feels lightheaded and c/o dry mouth.  Wife reports pt has felt fatigued over the past 2 weeks.  Pt only mentioned being concerned about his BP and generalized weakness.  Upon triage, RN asked pt if he has chest pain.  Pt states, "I don't have chest pain. I have chest tightness."  Pt reports chest tightness woke him up at 1:30am.  Also reports nausea. Denies vomiting. Denies sob.

## 2014-03-14 NOTE — Discharge Instructions (Signed)
Keep a record of your blood pressures for the next several days to be taken to your primary Dr. at your followup appointment.   Arterial Hypertension Arterial hypertension (high blood pressure) is a condition of elevated pressure in your blood vessels. Hypertension over a long period of time is a risk factor for strokes, heart attacks, and heart failure. It is also the leading cause of kidney (renal) failure.  CAUSES   In Adults -- Over 90% of all hypertension has no known cause. This is called essential or primary hypertension. In the other 10% of people with hypertension, the increase in blood pressure is caused by another disorder. This is called secondary hypertension. Important causes of secondary hypertension are:  Heavy alcohol use.  Obstructive sleep apnea.  Hyperaldosterosim (Conn's syndrome).  Steroid use.  Chronic kidney failure.  Hyperparathyroidism.  Medications.  Renal artery stenosis.  Pheochromocytoma.  Cushing's disease.  Coarctation of the aorta.  Scleroderma renal crisis.  Licorice (in excessive amounts).  Drugs (cocaine, methamphetamine). Your caregiver can explain any items above that apply to you.  In Children -- Secondary hypertension is more common and should always be considered.  Pregnancy -- Few women of childbearing age have high blood pressure. However, up to 10% of them develop hypertension of pregnancy. Generally, this will not harm the woman. It may be a sign of 3 complications of pregnancy: preeclampsia, HELLP syndrome, and eclampsia. Follow up and control with medication is necessary. SYMPTOMS   This condition normally does not produce any noticeable symptoms. It is usually found during a routine exam.  Malignant hypertension is a late problem of high blood pressure. It may have the following symptoms:  Headaches.  Blurred vision.  End-organ damage (this means your kidneys, heart, lungs, and other organs are being  damaged).  Stressful situations can increase the blood pressure. If a person with normal blood pressure has their blood pressure go up while being seen by their caregiver, this is often termed "white coat hypertension." Its importance is not known. It may be related with eventually developing hypertension or complications of hypertension.  Hypertension is often confused with mental tension, stress, and anxiety. DIAGNOSIS  The diagnosis is made by 3 separate blood pressure measurements. They are taken at least 1 week apart from each other. If there is organ damage from hypertension, the diagnosis may be made without repeat measurements. Hypertension is usually identified by having blood pressure readings:  Above 140/90 mmHg measured in both arms, at 3 separate times, over a couple weeks.  Over 130/80 mmHg should be considered a risk factor and may require treatment in patients with diabetes. Blood pressure readings over 120/80 mmHg are called "pre-hypertension" even in non-diabetic patients. To get a true blood pressure measurement, use the following guidelines. Be aware of the factors that can alter blood pressure readings.  Take measurements at least 1 hour after caffeine.  Take measurements 30 minutes after smoking and without any stress. This is another reason to quit smoking  it raises your blood pressure.  Use a proper cuff size. Ask your caregiver if you are not sure about your cuff size.  Most home blood pressure cuffs are automatic. They will measure systolic and diastolic pressures. The systolic pressure is the pressure reading at the start of sounds. Diastolic pressure is the pressure at which the sounds disappear. If you are elderly, measure pressures in multiple postures. Try sitting, lying or standing.  Sit at rest for a minimum of 5 minutes before taking measurements.  You should not be on any medications like decongestants. These are found in many cold medications.  Record  your blood pressure readings and review them with your caregiver. If you have hypertension:  Your caregiver may do tests to be sure you do not have secondary hypertension (see "causes" above).  Your caregiver may also look for signs of metabolic syndrome. This is also called Syndrome X or Insulin Resistance Syndrome. You may have this syndrome if you have type 2 diabetes, abdominal obesity, and abnormal blood lipids in addition to hypertension.  Your caregiver will take your medical and family history and perform a physical exam.  Diagnostic tests may include blood tests (for glucose, cholesterol, potassium, and kidney function), a urinalysis, or an EKG. Other tests may also be necessary depending on your condition. PREVENTION  There are important lifestyle issues that you can adopt to reduce your chance of developing hypertension:  Maintain a normal weight.  Limit the amount of salt (sodium) in your diet.  Exercise often.  Limit alcohol intake.  Get enough potassium in your diet. Discuss specific advice with your caregiver.  Follow a DASH diet (dietary approaches to stop hypertension). This diet is rich in fruits, vegetables, and low-fat dairy products, and avoids certain fats. PROGNOSIS  Essential hypertension cannot be cured. Lifestyle changes and medical treatment can lower blood pressure and reduce complications. The prognosis of secondary hypertension depends on the underlying cause. Many people whose hypertension is controlled with medicine or lifestyle changes can live a normal, healthy life.  RISKS AND COMPLICATIONS  While high blood pressure alone is not an illness, it often requires treatment due to its short- and long-term effects on many organs. Hypertension increases your risk for:  CVAs or strokes (cerebrovascular accident).  Heart failure due to chronically high blood pressure (hypertensive cardiomyopathy).  Heart attack (myocardial infarction).  Damage to the  retina (hypertensive retinopathy).  Kidney failure (hypertensive nephropathy). Your caregiver can explain list items above that apply to you. Treatment of hypertension can significantly reduce the risk of complications. TREATMENT   For overweight patients, weight loss and regular exercise are recommended. Physical fitness lowers blood pressure.  Mild hypertension is usually treated with diet and exercise. A diet rich in fruits and vegetables, fat-free dairy products, and foods low in fat and salt (sodium) can help lower blood pressure. Decreasing salt intake decreases blood pressure in a 1/3 of people.  Stop smoking if you are a smoker. The steps above are highly effective in reducing blood pressure. While these actions are easy to suggest, they are difficult to achieve. Most patients with moderate or severe hypertension end up requiring medications to bring their blood pressure down to a normal level. There are several classes of medications for treatment. Blood pressure pills (antihypertensives) will lower blood pressure by their different actions. Lowering the blood pressure by 10 mmHg may decrease the risk of complications by as much as 25%. The goal of treatment is effective blood pressure control. This will reduce your risk for complications. Your caregiver will help you determine the best treatment for you according to your lifestyle. What is excellent treatment for one person, may not be for you. HOME CARE INSTRUCTIONS   Do not smoke.  Follow the lifestyle changes outlined in the "Prevention" section.  If you are on medications, follow the directions carefully. Blood pressure medications must be taken as prescribed. Skipping doses reduces their benefit. It also puts you at risk for problems.  Follow up with your caregiver, as  directed.  If you are asked to monitor your blood pressure at home, follow the guidelines in the "Diagnosis" section above. SEEK MEDICAL CARE IF:   You think  you are having medication side effects.  You have recurrent headaches or lightheadedness.  You have swelling in your ankles.  You have trouble with your vision. SEEK IMMEDIATE MEDICAL CARE IF:   You have sudden onset of chest pain or pressure, difficulty breathing, or other symptoms of a heart attack.  You have a severe headache.  You have symptoms of a stroke (such as sudden weakness, difficulty speaking, difficulty walking). MAKE SURE YOU:   Understand these instructions.  Will watch your condition.  Will get help right away if you are not doing well or get worse. Document Released: 10/07/2005 Document Revised: 12/30/2011 Document Reviewed: 05/07/2007 [facility] Patient Information 2014 [place].

## 2014-03-14 NOTE — ED Provider Notes (Signed)
CSN: [id number]     Arrival date & time 03/14/14  0303 History   First MD Initiated Contact with Patient 03/14/14 [phone]     Chief Complaint  Patient presents with  . Hypertension  . Chest Pain     (Consider location/radiation/quality/duration/timing/severity/associated sxs/prior Treatment) HPI Comments: Patient is a 80 year old male with history of hypertension, stomach ulcers. He presents today with complaints of episodic elevated blood pressure associated with generalized malaise that has been occurring intermittently for the past 4 months. He denies any fevers or chills. He denies any nausea or vomiting. He has several other symptoms including a "pounding in his right ear", loss of appetite since he experienced his bleeding ulcer in October. He has been seen by ear nose and throat, cardiology, and his primary care provider, however a cause for his symptoms has not been identified. He presents tonight hoping for some answers to his symptoms.  Patient is a 80 y.o. male presenting with hypertension. The history is provided by the patient.  Hypertension This is a new problem. Episode onset: 4 months ago. The problem has been gradually worsening. Nothing aggravates the symptoms. He has tried nothing for the symptoms.    Past Medical History  Diagnosis Date  . Chest pain, unspecified     exertional  . Coronary atherosclerosis of unspecified type of vessel, native or graft     unspecified site, stent put in   . Other and unspecified hyperlipidemia   . Elevated prostate specific antigen (PSA)   . Special screening for malignant neoplasm of prostate    Past Surgical History  Procedure Laterality Date  . Tonsillectomy    . Coronary stent placement      CAD  . Tonsillectomy    . Esophagogastroduodenoscopy N/A 08/06/2013    Procedure: ESOPHAGOGASTRODUODENOSCOPY (EGD);  Surgeon: Wonda Horner, MD;  Location: [facility] ENDOSCOPY;  Service: Endoscopy;  Laterality: N/A;   Family History  Problem  Relation Age of Onset  . Stroke Mother 34  . Stroke Father 14   History  Substance Use Topics  . Smoking status: Former Research scientist (life sciences)  . Smokeless tobacco: Not on file     Comment: quit in [phone]  . Alcohol Use: No    Review of Systems  All other systems reviewed and are negative.     Allergies  Aspirin  Home Medications   Prior to Admission medications   Medication Sig Start Date End Date Taking? Authorizing Provider  aspirin 325 MG tablet Take 325 mg by mouth every other day.    Historical Provider, MD  atenolol (TENORMIN) 25 MG tablet Take 1 tablet (25 mg total) by mouth at bedtime. 02/16/14   Josue Hector, MD  Cyanocobalamin (VITAMIN B 12 PO) Take 1 tablet by mouth daily.    Historical Provider, MD  fish oil-omega-3 fatty acids 1000 MG capsule Take 1 g by mouth daily.    Historical Provider, MD  Ibuprofen 200 MG CAPS as needed.  12/09/13   Historical Provider, MD  multivitamin [facility]) per tablet Take 1 tablet by mouth daily.      Historical Provider, MD  nitroGLYCERIN (NITROSTAT) 0.4 MG SL tablet Place 1 tablet (0.4 mg total) under the tongue every 5 (five) minutes as needed. 05/26/12   Renella Cunas, MD  simvastatin (ZOCOR) 40 MG tablet Take 40 mg by mouth daily.      Historical Provider, MD   BP 152/79  Pulse 57  Temp(Src) 98.7 F (37.1 C) (Oral)  Resp 20  SpO2 94% Physical Exam  Nursing note and vitals reviewed. Constitutional: He is oriented to person, place, and time. He appears well-developed and well-nourished. No distress.  HENT:  Head: Normocephalic and atraumatic.  Mouth/Throat: Oropharynx is clear and moist.  TMs are clear bilaterally.  Eyes: EOM are normal. Pupils are equal, round, and reactive to light.  Neck: Normal range of motion. Neck supple.  Cardiovascular: Normal rate, regular rhythm and normal heart sounds.   No murmur heard. Pulmonary/Chest: Effort normal and breath sounds normal. No respiratory distress. He has no wheezes.  Abdominal:  Soft. Bowel sounds are normal. He exhibits no distension.  Musculoskeletal: Normal range of motion. He exhibits no edema and no tenderness.  Lymphadenopathy:    He has no cervical adenopathy.  Neurological: He is alert and oriented to person, place, and time. No cranial nerve deficit. He exhibits normal muscle tone. Coordination normal.  Skin: Skin is warm and dry. He is not diaphoretic.    ED Course  Procedures (including critical care time) Labs Review Labs Reviewed  CBC  BASIC METABOLIC PANEL  Randolm Idol, ED    Imaging Review Dg Chest 2 View  03/14/2014   CLINICAL DATA:  Hypertension.  EXAM: CHEST  2 VIEW  COMPARISON:  DG CHEST 1V PORT dated 08/06/2013  FINDINGS: Cardiac silhouette appears mildly enlarged. Mediastinal silhouette is unremarkable. Mild flattening of the hemidiaphragms with strandy densities. No pleural effusions or focal consolidations. Minimal scarring right lung base. No pneumothorax. Soft tissue planes and included osseous structures are nonsuspicious.  IMPRESSION: Mild cardiomegaly, suspected COPD with bibasilar atelectasis and scarring.   Electronically Signed   By: Elon Alas   On: 03/14/2014 04:28     EKG Interpretation   Date/Time:  Monday Mar 14 2014 03:27:41 EDT Ventricular Rate:  55 PR Interval:  204 QRS Duration: 96 QT Interval:  436 QTC Calculation: 417 R Axis:   33 Text Interpretation:  Sinus bradycardia Otherwise normal ECG No  significant change since 08/06/13 Confirmed by DELOS  MD, Hattye Siegfried ([id number])  on 03/14/2014 5:13:02 AM      MDM   Final diagnoses:  None    Patient is a 80 year old male who presents with complaints of generalized malaise and elevated blood pressure that has been occurring intermittently for the past 4 months. He has assorted other complaints including a "pounding in his right ear", loss of appetite. He is been seen by multiple physicians for similar complaints, however no causes been found. He comes in  today hoping to find answers that he has not yet been provided with. Workup performed today reveals no evidence for blood Or electrolyte abnormality. His EKG and troponin are unchanged. CT scan of the head reveals no evidence for stroke or reason for the sensations in his ear. At this point, I do not feel as though further workup is indicated. He will be discharged to home and instructed to followup with his primary Dr. I see no emergent cause and no indication for admission.    Veryl Speak, MD 03/14/14 [phone]

## 2014-05-26 ENCOUNTER — Ambulatory Visit: Payer: Commercial Managed Care - HMO | Admitting: Cardiovascular Disease

## 2014-07-04 ENCOUNTER — Ambulatory Visit (HOSPITAL_COMMUNITY)
Admission: RE | Admit: 2014-07-04 | Discharge: 2014-07-04 | Disposition: A | Discharge status: Home / Self Care | Payer: Medicare HMO | Source: Ambulatory Visit

## 2014-07-04 ENCOUNTER — Other Ambulatory Visit (HOSPITAL_COMMUNITY): Payer: Self-pay

## 2014-07-04 DIAGNOSIS — R52 Pain, unspecified: Secondary | ICD-10-CM

## 2014-07-04 DIAGNOSIS — M546 Pain in thoracic spine: Secondary | ICD-10-CM | POA: Diagnosis present

## 2014-12-09 ENCOUNTER — Telehealth: Payer: Self-pay

## 2014-12-09 NOTE — Telephone Encounter (Signed)
Pt called, sates he is feeling his heartbeart in his ear, states this has been going on for a while. Dr. Johnsie Cancel pt, but wants to be seen in [place]. Has appt with Dr. Fletcher Anon on 3/3

## 2014-12-10 NOTE — Telephone Encounter (Signed)
This could be related to elevated blood pressure. He should check his blood pressure and inform us if elevated.

## 2014-12-12 NOTE — Telephone Encounter (Signed)
LVM 2/22

## 2014-12-13 NOTE — Telephone Encounter (Signed)
Patient stated that his blood pressure has been up and down  From 120/70 hr rate 70 When his pressure is elevated it is around 140/80 Instructed patient to keep follow and and take his vitals when he is experiencing "the heartbeat in his ear"  Instructed him to contact EMS if his situation becomes emergent  Patient verbalized understanding   Discussed with Dr. Fletcher Anon  He verbally agrees

## 2014-12-15 ENCOUNTER — Telehealth: Payer: Self-pay | Admitting: *Deleted

## 2014-12-15 NOTE — Telephone Encounter (Signed)
Patient had an episode of the heart beat in his ear  He took his vitals at that time  119/76 Heart rate 71   His blood pressure had been 120/70 the last several day s I informed patient this was a normal blood pressure  Advised patient to keep his 3/3 appt

## 2014-12-22 ENCOUNTER — Institutional Professional Consult (permissible substitution): Payer: Medicare HMO | Admitting: Cardiovascular Disease

## 2014-12-22 ENCOUNTER — Ambulatory Visit (INDEPENDENT_AMBULATORY_CARE_PROVIDER_SITE_OTHER): Payer: PPO | Admitting: Cardiovascular Disease

## 2014-12-22 ENCOUNTER — Encounter: Payer: Self-pay | Admitting: Cardiovascular Disease

## 2014-12-22 VITALS — BP 132/82 | HR 81 | Ht 73.0 in | Wt 204.5 lb

## 2014-12-22 DIAGNOSIS — H9311 Tinnitus, right ear: Secondary | ICD-10-CM

## 2014-12-22 DIAGNOSIS — I2510 Atherosclerotic heart disease of native coronary artery without angina pectoris: Secondary | ICD-10-CM

## 2014-12-22 DIAGNOSIS — R002 Palpitations: Secondary | ICD-10-CM

## 2014-12-22 DIAGNOSIS — H93A1 Pulsatile tinnitus, right ear: Secondary | ICD-10-CM

## 2014-12-22 DIAGNOSIS — I10 Essential (primary) hypertension: Secondary | ICD-10-CM

## 2014-12-22 DIAGNOSIS — E785 Hyperlipidemia, unspecified: Secondary | ICD-10-CM

## 2014-12-22 NOTE — Patient Instructions (Signed)
Continue same medications.   Your physician wants you to follow-up in: 6 months.  You will receive a reminder letter in the mail two months in advance. If you don't receive a letter, please call our office to schedule the follow-up appointment.  

## 2014-12-25 ENCOUNTER — Encounter: Payer: Self-pay | Admitting: Cardiovascular Disease

## 2014-12-25 DIAGNOSIS — H93A1 Pulsatile tinnitus, right ear: Secondary | ICD-10-CM | POA: Insufficient documentation

## 2014-12-25 NOTE — Assessment & Plan Note (Signed)
He is doing very well with no symptoms suggestive of angina. Continue medical therapy. 

## 2014-12-25 NOTE — Progress Notes (Signed)
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 Johnsie Cancel. He has known history of coronary artery disease with stenting of the proximal LAD by Dr. Olevia Perches with a Cypher drug-eluting stent. He had residual mild to moderate coronary artery disease and normal ejection fraction. He has not been on any antiplatelet agent in years due to hematuria with negative cystoscopy by Jeffie Pollock . Has pulsatile tinnitus over the last 12 months worse in right ear. Seen by Dr Wilburn Cornelia with extensive workup. He is still frustrated with this. He reports having carotid Doppler done which was unremarkable. He wakes up every morning with pounding sensation in his right ear. He denies chest pain or shortness of breath.   Allergies  Allergen Reactions  . Aspirin     REACTION: blood in urine     Current Outpatient Prescriptions on File Prior to Visit  Medication Sig Dispense Refill  . Cyanocobalamin (VITAMIN B 12 PO) Take 1 tablet by mouth daily.    . fish oil-omega-3 fatty acids 1000 MG capsule Take 1 g by mouth daily.    . Ibuprofen 200 MG CAPS Take 200 mg by mouth daily as needed (pain).     . multivitamin (THERAGRAN) per tablet Take 1 tablet by mouth daily.      . nitroGLYCERIN (NITROSTAT) 0.4 MG SL tablet Place 0.4 mg under the tongue every 5 (five) minutes as needed for chest pain.     No current facility-administered medications on file prior to visit.     Past Medical History  Diagnosis Date  . Chest pain, unspecified     exertional  . Coronary atherosclerosis of unspecified type of vessel, native or graft     unspecified site, stent put in   . Other and unspecified hyperlipidemia   . Elevated prostate specific antigen (PSA)   . Special screening for malignant neoplasm of prostate      Past Surgical History  Procedure Laterality Date  . Tonsillectomy    . Coronary stent placement      CAD  . Tonsillectomy    . Esophagogastroduodenoscopy  N/A 08/06/2013    Procedure: ESOPHAGOGASTRODUODENOSCOPY (EGD);  Surgeon: Wonda Horner, MD;  Location: [facility] END[facility]COPY;  Service: Endoscopy;  Laterality: N/A;     Family History  Problem Relation Age of Onset  . Stroke Mother 33  . Stroke Father 33     History   Social History  . Marital Status: Married    Spouse Name: N/A  . Number of Children: N/A  . Years of Education: N/A   Occupational History  . retired      Company secretary    Social History Main Topics  . Smoking status: Former Research scientist (life sciences)  . Smokeless tobacco: Not on file     Comment: quit in [phone]  . Alcohol Use: No  . Drug Use: No  . Sexual Activity: Not on file   Other Topics Concern  . Not on file   Social History Narrative   Married. Walks for exercise- really enjoys it.       PHYSICAL EXAM   BP 132/82 mmHg  Pulse 81  Ht 6\' 1"  (1.854 m)  Wt 204 lb 8 oz (92.761 kg)  BMI 26.99 kg/m2 Constitutional: He is oriented to person, place, and time. He appears well-developed and well-nourished. No distress.  HENT: No nasal discharge.  Head: Normocephalic and atraumatic.  Eyes: Pupils are equal and round.  No discharge.  Neck: Normal range of motion. Neck supple. No JVD present. No thyromegaly present. No carotid bruits Cardiovascular: Normal rate, regular rhythm, normal heart sounds. Exam reveals no gallop and no friction rub. No murmur heard.  Pulmonary/Chest: Effort normal and breath sounds normal. No stridor. No respiratory distress. He has no wheezes. He has no rales. He exhibits no tenderness.  Abdominal: Soft. Bowel sounds are normal. He exhibits no distension. There is no tenderness. There is no rebound and no guarding.  Musculoskeletal: Normal range of motion. He exhibits no edema and no tenderness.  Neurological: He is alert and oriented to person, place, and time. Coordination normal.  Skin: Skin is warm and dry. No rash noted. He is not diaphoretic. No erythema. No pallor.  Psychiatric: He has a normal  mood and affect. His behavior is normal. Judgment and thought content normal.       RFX:JOITG  Rhythm  WITHIN NORMAL LIMITS   ASSESSMENT AND PLAN

## 2014-12-25 NOTE — Assessment & Plan Note (Signed)
I really don't think this is related to a cardiac issue. He had extensive workup by ENT according to him with no clear findings. I suggested neurology evaluation.

## 2014-12-25 NOTE — Assessment & Plan Note (Signed)
Blood pressure is reasonably controlled. 

## 2014-12-25 NOTE — Assessment & Plan Note (Signed)
He reports unspecified side effects to statins and currently is on Zetia.

## 2015-04-17 ENCOUNTER — Other Ambulatory Visit: Payer: Self-pay

## 2015-08-16 ENCOUNTER — Telehealth: Payer: Self-pay | Admitting: Cardiovascular Disease

## 2015-08-16 NOTE — Telephone Encounter (Signed)
Patient fu at [place] .  Does not need to fu with [place] at this time.     Deleting recall.

## 2015-09-06 IMAGING — CT CT HEAD W/O CM
2 series · 15 of 30 positions shown, 19 images · non-contrast
Comparison: None.

CLINICAL DATA: Hypertension.  Lightheadedness.  Fatigue.  Nausea.

EXAM:
CT HEAD WITHOUT CONTRAST
TECHNIQUE: Contiguous axial images were obtained from the base of the skull
through the vertex without intravenous contrast.

[Series 201: head w/o, idose (1) · axial · non-contrast · 0.49mm/px · z∈[+104,+224]mm · 13 of 30 slices shown, 17 images]
[im 3/30  brain]
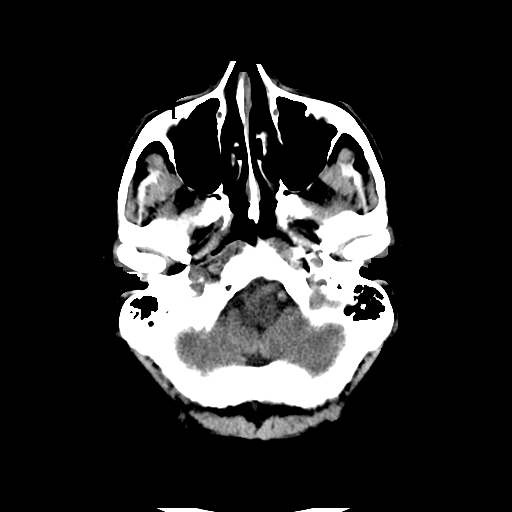
[im 3/30  bone]
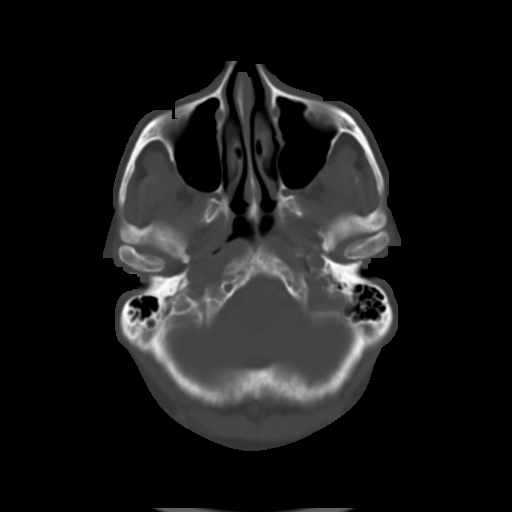
[im 5/30  brain]
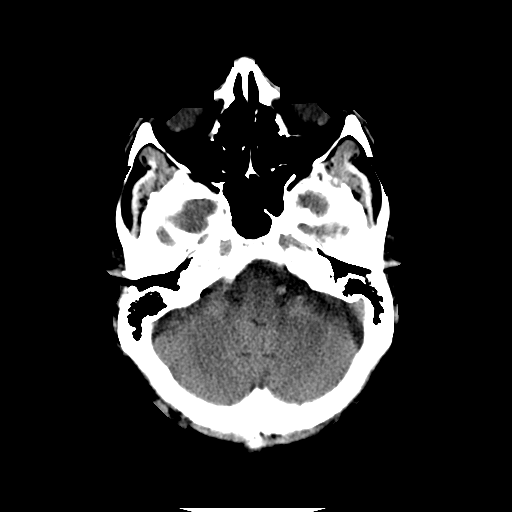
[im 7/30  brain]
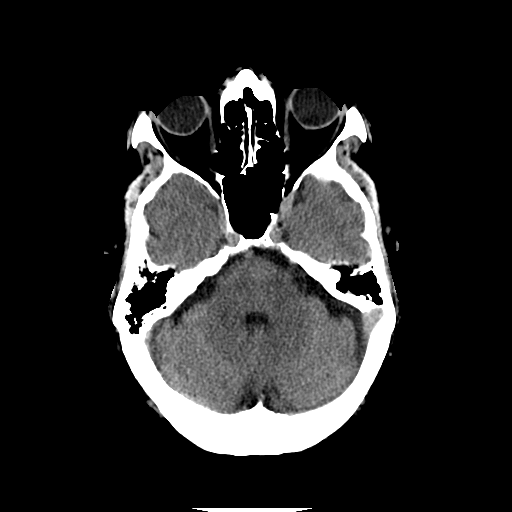
[im 9/30  brain]
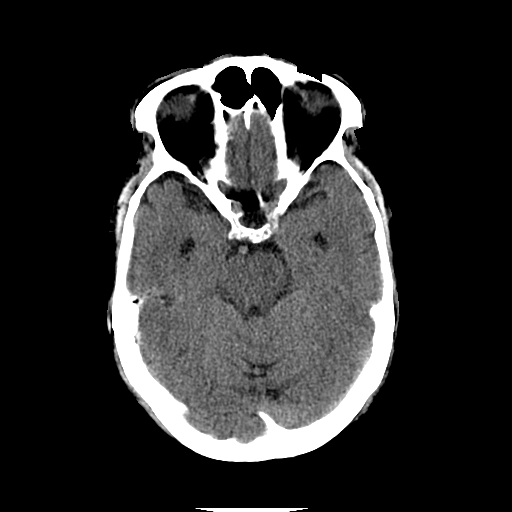
[im 11/30  brain]
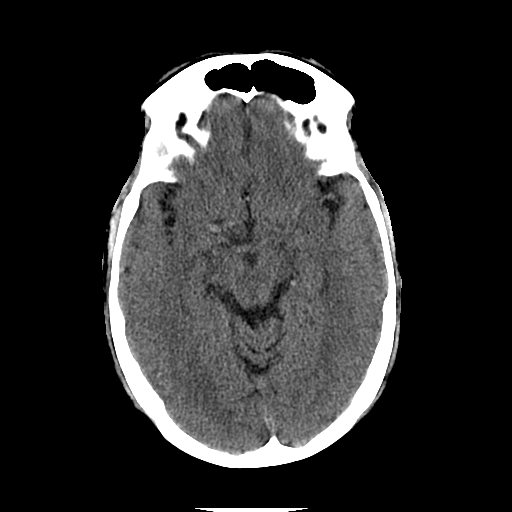
[im 11/30  bone]
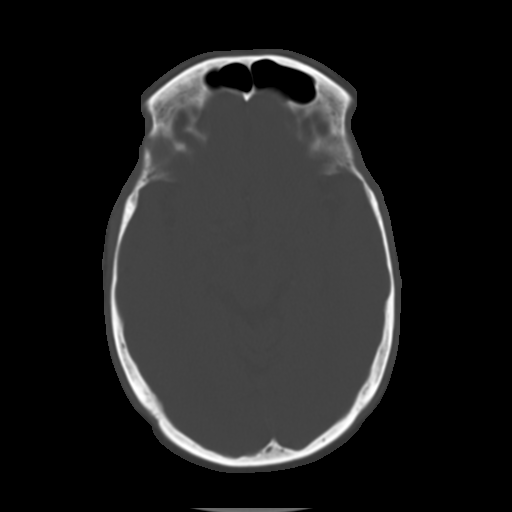
[im 13/30  brain]
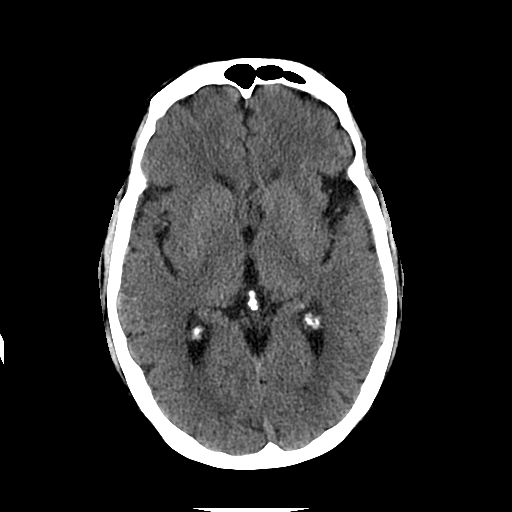
[im 15/30  brain]
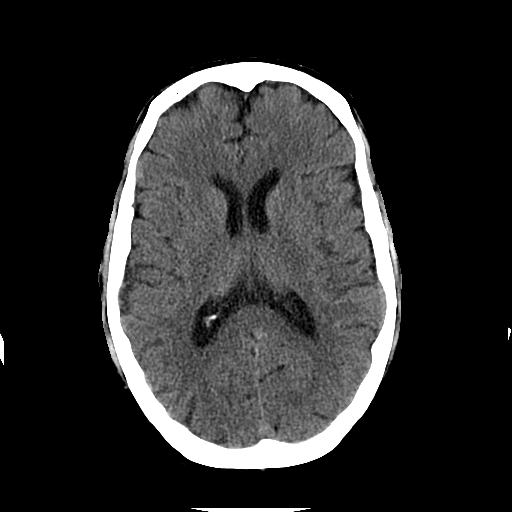
[im 17/30  brain]
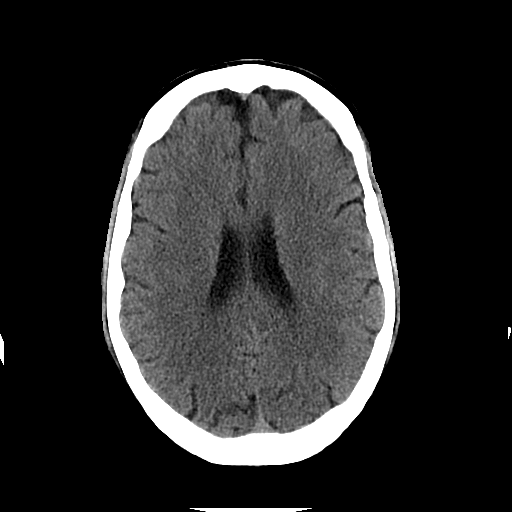
[im 19/30  brain]
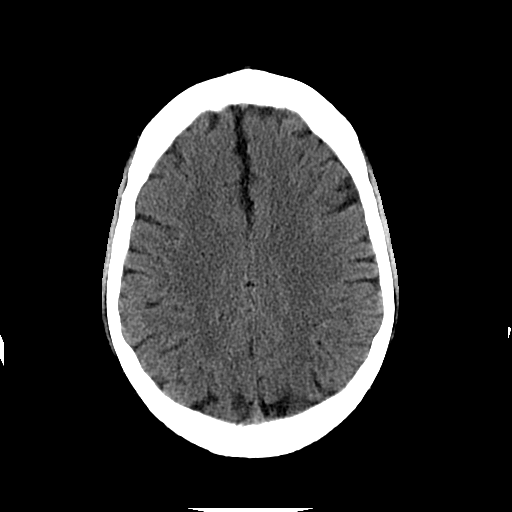
[im 19/30  bone]
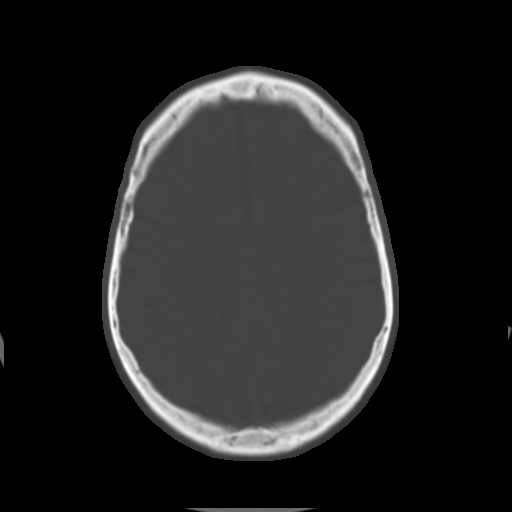
[im 21/30  brain]
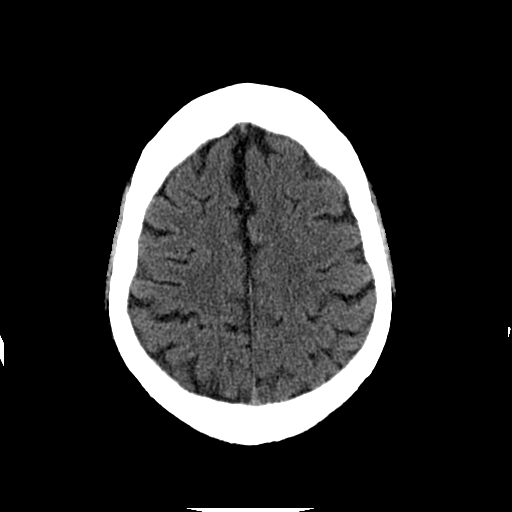
[im 23/30  brain]
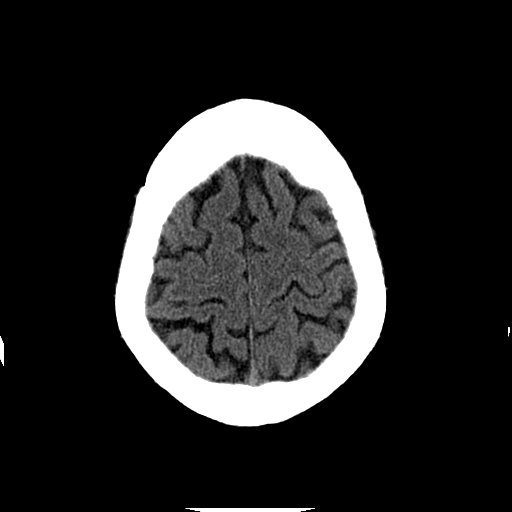
[im 25/30  brain]
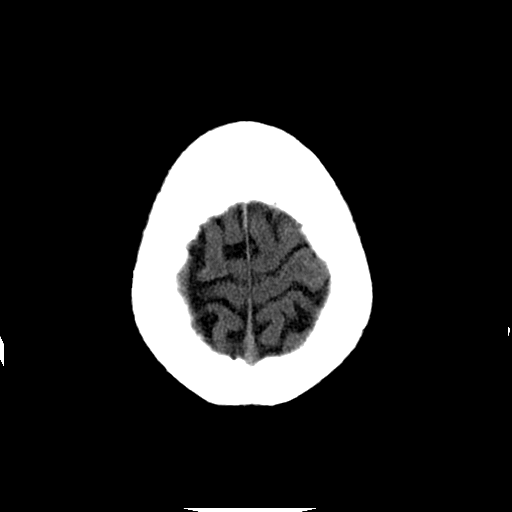
[im 27/30  brain]
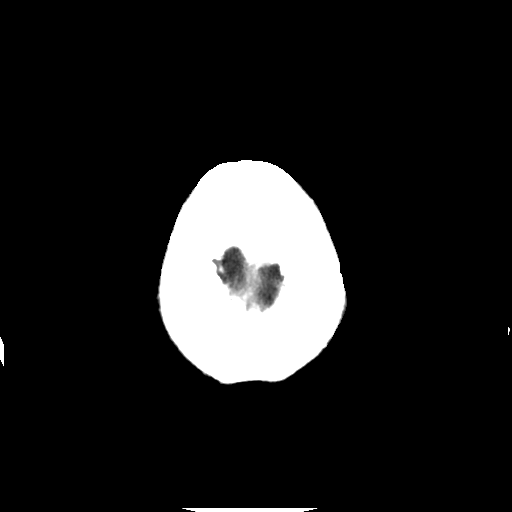
[im 27/30  bone]
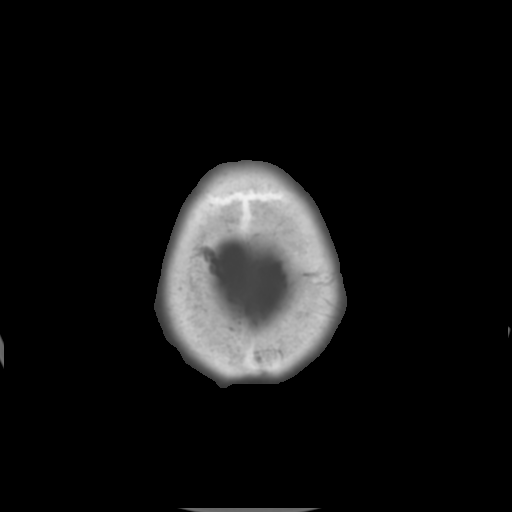

[Series 202: head w/o bone, idose (1) · axial · non-contrast · 0.49mm/px · z∈[+104,+124]mm · 2 of 30 slices shown]
[im 3/30  bone]
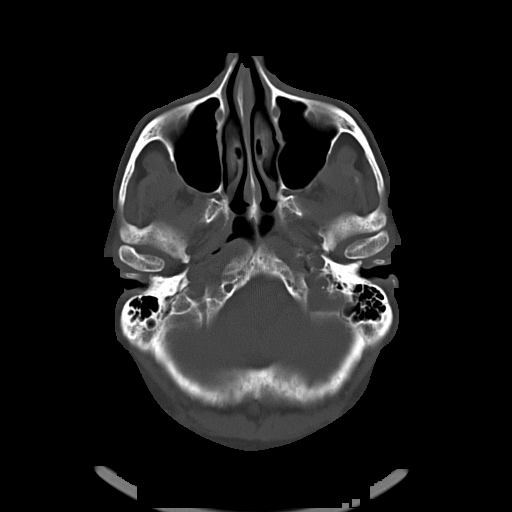
[im 7/30  bone]
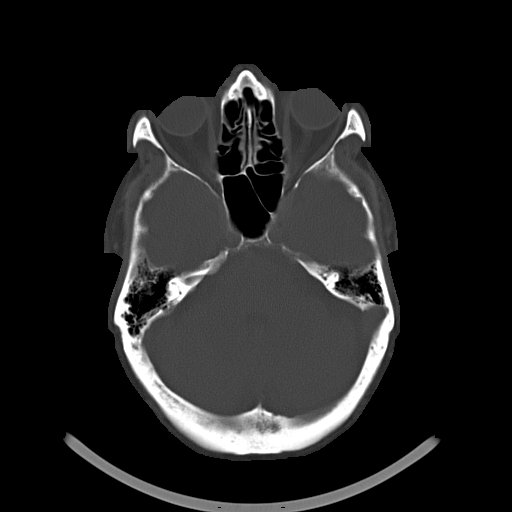

[15 of 30 positions shown; findings below may reference images not displayed]

FINDINGS: There is no evidence of acute infarction, mass lesion, or intra- or
extra-axial hemorrhage on CT.

The posterior fossa, including the cerebellum, brainstem and fourth
ventricle, is within normal limits. The third and lateral
ventricles, and basal ganglia are unremarkable in appearance. The
cerebral hemispheres are symmetric in appearance, with normal
gray-white differentiation. No mass effect or midline shift is seen.

There is no evidence of fracture; visualized osseous structures are
unremarkable in appearance. The orbits are within normal limits. The
paranasal sinuses and mastoid air cells are well-aerated. No
significant soft tissue abnormalities are seen.
IMPRESSION: Unremarkable noncontrast CT of the head.

## 2015-10-26 DIAGNOSIS — Z79899 Other long term (current) drug therapy: Secondary | ICD-10-CM | POA: Diagnosis not present

## 2015-10-26 DIAGNOSIS — I2510 Atherosclerotic heart disease of native coronary artery without angina pectoris: Secondary | ICD-10-CM | POA: Diagnosis not present

## 2015-10-26 DIAGNOSIS — Z125 Encounter for screening for malignant neoplasm of prostate: Secondary | ICD-10-CM | POA: Diagnosis not present

## 2015-10-26 DIAGNOSIS — K4090 Unilateral inguinal hernia, without obstruction or gangrene, not specified as recurrent: Secondary | ICD-10-CM | POA: Diagnosis not present

## 2015-10-26 DIAGNOSIS — R030 Elevated blood-pressure reading, without diagnosis of hypertension: Secondary | ICD-10-CM | POA: Diagnosis not present

## 2015-10-26 DIAGNOSIS — E782 Mixed hyperlipidemia: Secondary | ICD-10-CM | POA: Diagnosis not present

## 2015-11-08 DIAGNOSIS — K4020 Bilateral inguinal hernia, without obstruction or gangrene, not specified as recurrent: Secondary | ICD-10-CM | POA: Diagnosis not present

## 2015-11-13 ENCOUNTER — Encounter
Admission: RE | Admit: 2015-11-13 | Discharge: 2015-11-13 | Disposition: A | Discharge status: Home / Self Care | Payer: PPO | Source: Ambulatory Visit | Attending: Surgery | Admitting: Surgery

## 2015-11-13 DIAGNOSIS — E785 Hyperlipidemia, unspecified: Secondary | ICD-10-CM | POA: Diagnosis not present

## 2015-11-13 DIAGNOSIS — I2510 Atherosclerotic heart disease of native coronary artery without angina pectoris: Secondary | ICD-10-CM | POA: Diagnosis not present

## 2015-11-13 DIAGNOSIS — K4090 Unilateral inguinal hernia, without obstruction or gangrene, not specified as recurrent: Secondary | ICD-10-CM | POA: Diagnosis not present

## 2015-11-13 DIAGNOSIS — Z808 Family history of malignant neoplasm of other organs or systems: Secondary | ICD-10-CM | POA: Diagnosis not present

## 2015-11-13 DIAGNOSIS — Z801 Family history of malignant neoplasm of trachea, bronchus and lung: Secondary | ICD-10-CM | POA: Diagnosis not present

## 2015-11-13 DIAGNOSIS — D176 Benign lipomatous neoplasm of spermatic cord: Secondary | ICD-10-CM | POA: Diagnosis not present

## 2015-11-13 DIAGNOSIS — Z823 Family history of stroke: Secondary | ICD-10-CM | POA: Diagnosis not present

## 2015-11-13 DIAGNOSIS — Z955 Presence of coronary angioplasty implant and graft: Secondary | ICD-10-CM | POA: Diagnosis not present

## 2015-11-13 DIAGNOSIS — Z87891 Personal history of nicotine dependence: Secondary | ICD-10-CM | POA: Diagnosis not present

## 2015-11-13 DIAGNOSIS — Z79899 Other long term (current) drug therapy: Secondary | ICD-10-CM | POA: Diagnosis not present

## 2015-11-13 DIAGNOSIS — Z8249 Family history of ischemic heart disease and other diseases of the circulatory system: Secondary | ICD-10-CM | POA: Diagnosis not present

## 2015-11-13 DIAGNOSIS — Z9849 Cataract extraction status, unspecified eye: Secondary | ICD-10-CM | POA: Diagnosis not present

## 2015-11-13 HISTORY — DX: Benign prostatic hyperplasia without lower urinary tract symptoms: N40.0

## 2015-11-13 HISTORY — DX: Depression, unspecified: F32.A

## 2015-11-13 HISTORY — DX: Major depressive disorder, single episode, unspecified: F32.9

## 2015-11-13 NOTE — Patient Instructions (Signed)
  Your procedure is scheduled on: 11/21/15 Tues Report to Day Surgery.2nd floor medical mall To find out your arrival time please call [phone] between 1PM - 3PM on 11/20/15 Mon.  Remember: Instructions that are not followed completely may result in serious medical risk, up to and including death, or upon the discretion of your surgeon and anesthesiologist your surgery may need to be rescheduled.    _x___ 1. Do not eat food or drink liquids after midnight. No gum chewing or hard candies.     ____ 2. No Alcohol for 24 hours before or after surgery.   ____ 3. Bring all medications with you on the day of surgery if instructed.    __x__ 4. Notify your doctor if there is any change in your medical condition     (cold, fever, infections).     Do not wear jewelry, make-up, hairpins, clips or nail polish.  Do not wear lotions, powders, or perfumes. You may wear deodorant.  Do not shave 48 hours prior to surgery. Men may shave face and neck.  Do not bring valuables to the hospital.    [facility] is not responsible for any belongings or valuables.               Contacts, dentures or bridgework may not be worn into surgery.  Leave your suitcase in the car. After surgery it may be brought to your room.  For patients admitted to the hospital, discharge time is determined by your                treatment team.   Patients discharged the day of surgery will not be allowed to drive home.   Please read over the following fact sheets that you were given:      _x___ Take these medicines the morning of surgery with A SIP OF WATER:    1.   2.   3.   4.  5.  6.  ____ Fleet Enema (as directed)   _x___ Use CHG Soap as directed  ____ Use inhalers on the day of surgery  ____ Stop metformin 2 days prior to surgery    ____ Take 1/2 of usual insulin dose the night before surgery and none on the morning of surgery.   ____ Stop Coumadin/Plavix/aspirin on   _x___ Stop Anti-inflammatories on  Stop Ibuprofen 1 week before surgery   _x__ Stop supplements until after surgery.  Stop fish oil 1 week before surgery  ____ Bring C-Pap to the hospital.

## 2015-11-21 ENCOUNTER — Ambulatory Visit
Admission: RE | Admit: 2015-11-21 | Discharge: 2015-11-21 | Disposition: A | Discharge status: Home / Self Care | Payer: PPO | Source: Ambulatory Visit | Attending: Surgery | Admitting: Surgery

## 2015-11-21 ENCOUNTER — Encounter
Admission: RE | Disposition: A | Discharge status: Home / Self Care | Payer: Self-pay | Source: Ambulatory Visit | Attending: Surgery

## 2015-11-21 ENCOUNTER — Ambulatory Visit: Payer: PPO | Admitting: Anesthesiology

## 2015-11-21 ENCOUNTER — Encounter: Payer: Self-pay | Admitting: *Deleted

## 2015-11-21 DIAGNOSIS — K4090 Unilateral inguinal hernia, without obstruction or gangrene, not specified as recurrent: Secondary | ICD-10-CM | POA: Diagnosis not present

## 2015-11-21 DIAGNOSIS — Z955 Presence of coronary angioplasty implant and graft: Secondary | ICD-10-CM | POA: Diagnosis not present

## 2015-11-21 DIAGNOSIS — I2510 Atherosclerotic heart disease of native coronary artery without angina pectoris: Secondary | ICD-10-CM | POA: Insufficient documentation

## 2015-11-21 DIAGNOSIS — Z801 Family history of malignant neoplasm of trachea, bronchus and lung: Secondary | ICD-10-CM | POA: Diagnosis not present

## 2015-11-21 DIAGNOSIS — Z8249 Family history of ischemic heart disease and other diseases of the circulatory system: Secondary | ICD-10-CM | POA: Insufficient documentation

## 2015-11-21 DIAGNOSIS — E785 Hyperlipidemia, unspecified: Secondary | ICD-10-CM | POA: Diagnosis not present

## 2015-11-21 DIAGNOSIS — D176 Benign lipomatous neoplasm of spermatic cord: Secondary | ICD-10-CM | POA: Insufficient documentation

## 2015-11-21 DIAGNOSIS — Z808 Family history of malignant neoplasm of other organs or systems: Secondary | ICD-10-CM | POA: Diagnosis not present

## 2015-11-21 DIAGNOSIS — Z823 Family history of stroke: Secondary | ICD-10-CM | POA: Diagnosis not present

## 2015-11-21 DIAGNOSIS — Z87891 Personal history of nicotine dependence: Secondary | ICD-10-CM | POA: Insufficient documentation

## 2015-11-21 DIAGNOSIS — Z9849 Cataract extraction status, unspecified eye: Secondary | ICD-10-CM | POA: Diagnosis not present

## 2015-11-21 DIAGNOSIS — Z79899 Other long term (current) drug therapy: Secondary | ICD-10-CM | POA: Insufficient documentation

## 2015-11-21 DIAGNOSIS — F329 Major depressive disorder, single episode, unspecified: Secondary | ICD-10-CM | POA: Diagnosis not present

## 2015-11-21 HISTORY — PX: INGUINAL HERNIA REPAIR: SHX194

## 2015-11-21 SURGERY — REPAIR, HERNIA, INGUINAL, ADULT
Anesthesia: General | Laterality: Left | Wound class: Clean

## 2015-11-21 MED ORDER — ONDANSETRON HCL 4 MG/2ML IJ SOLN
INTRAMUSCULAR | Status: DC | PRN
  Administered 2015-11-21: 4 mg via INTRAVENOUS

## 2015-11-21 MED ORDER — CEFAZOLIN SODIUM-DEXTROSE 2-3 GM-% IV SOLR
INTRAVENOUS | Status: DC | PRN
  Administered 2015-11-21: 2 g via INTRAVENOUS

## 2015-11-21 MED ORDER — FAMOTIDINE 20 MG PO TABS
20.0000 mg | ORAL_TABLET | Freq: Once | ORAL | Status: AC
  Administered 2015-11-21: 20 mg via ORAL

## 2015-11-21 MED ORDER — FENTANYL CITRATE (PF) 100 MCG/2ML IJ SOLN
INTRAMUSCULAR | Status: AC
  Filled 2015-11-21: qty 2

## 2015-11-21 MED ORDER — ALPRAZOLAM 0.5 MG PO TABS
ORAL_TABLET | ORAL | Status: AC
  Administered 2015-11-21: 0.5 mg via ORAL
  Filled 2015-11-21: qty 1

## 2015-11-21 MED ORDER — BUPIVACAINE-EPINEPHRINE (PF) 0.5% -1:200000 IJ SOLN
INTRAMUSCULAR | Status: DC | PRN
  Administered 2015-11-21: 15 mL via PERINEURAL

## 2015-11-21 MED ORDER — ONDANSETRON HCL 4 MG/2ML IJ SOLN: 4.0000 mg | Freq: Once | INTRAMUSCULAR | Status: DC | PRN

## 2015-11-21 MED ORDER — LACTATED RINGERS IV SOLN
INTRAVENOUS | Status: DC
  Administered 2015-11-21 (×2): via INTRAVENOUS

## 2015-11-21 MED ORDER — FENTANYL CITRATE (PF) 100 MCG/2ML IJ SOLN
INTRAMUSCULAR | Status: DC | PRN
  Administered 2015-11-21: 50 ug via INTRAVENOUS
  Administered 2015-11-21 (×2): 25 ug via INTRAVENOUS

## 2015-11-21 MED ORDER — PROPOFOL 10 MG/ML IV BOLUS
INTRAVENOUS | Status: DC | PRN
  Administered 2015-11-21: 30 mg via INTRAVENOUS
  Administered 2015-11-21: 170 mg via INTRAVENOUS

## 2015-11-21 MED ORDER — BUPIVACAINE-EPINEPHRINE (PF) 0.5% -1:200000 IJ SOLN
INTRAMUSCULAR | Status: AC
  Filled 2015-11-21: qty 30

## 2015-11-21 MED ORDER — CEFAZOLIN SODIUM-DEXTROSE 2-3 GM-% IV SOLR
2.0000 g | Freq: Once | INTRAVENOUS | Status: AC
  Administered 2015-11-21: 2 g via INTRAVENOUS

## 2015-11-21 MED ORDER — FAMOTIDINE 20 MG PO TABS
ORAL_TABLET | ORAL | Status: AC
  Administered 2015-11-21: 20 mg via ORAL
  Filled 2015-11-21: qty 1

## 2015-11-21 MED ORDER — HYDROCODONE-ACETAMINOPHEN 5-325 MG PO TABS: 1.0000 | ORAL_TABLET | ORAL | Status: DC | PRN

## 2015-11-21 MED ORDER — HYDROCODONE-ACETAMINOPHEN 5-325 MG PO TABS
ORAL_TABLET | ORAL | Status: AC
  Administered 2015-11-21: 1 via ORAL
  Filled 2015-11-21: qty 1

## 2015-11-21 MED ORDER — FENTANYL CITRATE (PF) 100 MCG/2ML IJ SOLN
25.0000 ug | INTRAMUSCULAR | Status: DC | PRN
  Administered 2015-11-21 (×2): 25 ug via INTRAVENOUS

## 2015-11-21 MED ORDER — HYDROCODONE-ACETAMINOPHEN 5-325 MG PO TABS
ORAL_TABLET | ORAL | Status: AC
  Filled 2015-11-21: qty 1

## 2015-11-21 MED ORDER — PHENYLEPHRINE HCL 10 MG/ML IJ SOLN
INTRAMUSCULAR | Status: DC | PRN
  Administered 2015-11-21 (×4): 100 ug via INTRAVENOUS

## 2015-11-21 MED ORDER — MIDAZOLAM HCL 2 MG/2ML IJ SOLN
INTRAMUSCULAR | Status: DC | PRN
  Administered 2015-11-21: 2 mg via INTRAVENOUS

## 2015-11-21 MED ORDER — HYDROCODONE-ACETAMINOPHEN 5-325 MG PO TABS
1.0000 | ORAL_TABLET | ORAL | Status: DC | PRN
  Administered 2015-11-21 (×2): 1 via ORAL

## 2015-11-21 MED ORDER — CEFAZOLIN SODIUM-DEXTROSE 2-3 GM-% IV SOLR
INTRAVENOUS | Status: AC
  Administered 2015-11-21: 2 g via INTRAVENOUS
  Filled 2015-11-21: qty 50

## 2015-11-21 MED ORDER — ALPRAZOLAM 0.5 MG PO TABS
0.5000 mg | ORAL_TABLET | Freq: Once | ORAL | Status: AC
  Administered 2015-11-21: 0.5 mg via ORAL

## 2015-11-21 MED ORDER — LIDOCAINE HCL (CARDIAC) 20 MG/ML IV SOLN
INTRAVENOUS | Status: DC | PRN
  Administered 2015-11-21: 50 mg via INTRAVENOUS

## 2015-11-21 MED ORDER — ALPRAZOLAM 0.5 MG PO TABS: 0.5000 mg | ORAL_TABLET | Freq: Three times a day (TID) | ORAL | Status: DC | PRN

## 2015-11-21 SURGICAL SUPPLY — 30 items
BLADE SURG 15 STRL LF DISP TIS (BLADE) ×1 IMPLANT
BLADE SURG 15 STRL SS (BLADE) ×3
CANISTER SUCT 1200ML W/VALVE (MISCELLANEOUS) ×3 IMPLANT
CHLORAPREP W/TINT 26ML (MISCELLANEOUS) ×3 IMPLANT
DRAIN PENROSE 5/8X18 LTX STRL (WOUND CARE) ×3 IMPLANT
DRAPE LAPAROTOMY 77X122 PED (DRAPES) ×3 IMPLANT
ELECT REM PT RETURN 9FT ADLT (ELECTROSURGICAL) ×3
ELECTRODE REM PT RTRN 9FT ADLT (ELECTROSURGICAL) ×1 IMPLANT
GLOVE BIO SURGEON STRL SZ 6.5 (GLOVE) ×2 IMPLANT
GLOVE BIO SURGEON STRL SZ7.5 (GLOVE) ×3 IMPLANT
GLOVE BIO SURGEONS STRL SZ 6.5 (GLOVE) ×2
GLOVE BIOGEL PI IND STRL 7.0 (GLOVE) IMPLANT
GLOVE BIOGEL PI INDICATOR 7.0 (GLOVE) ×4
GOWN STRL REUS W/ TWL LRG LVL3 (GOWN DISPOSABLE) ×3 IMPLANT
GOWN STRL REUS W/TWL LRG LVL3 (GOWN DISPOSABLE) ×12
KIT RM TURNOVER STRD PROC AR (KITS) ×3 IMPLANT
LABEL OR SOLS (LABEL) ×3 IMPLANT
LIQUID BAND (GAUZE/BANDAGES/DRESSINGS) ×3 IMPLANT
MESH SYNTHETIC 4X6 SOFT BARD (Mesh General) ×1 IMPLANT
MESH SYNTHETIC SOFT BARD 4X6 (Mesh General) ×2 IMPLANT
NDL HYPO 25X1 1.5 SAFETY (NEEDLE) ×1 IMPLANT
NEEDLE HYPO 25X1 1.5 SAFETY (NEEDLE) ×3 IMPLANT
NS IRRIG 500ML POUR BTL (IV SOLUTION) ×3 IMPLANT
PACK BASIN MINOR ARMC (MISCELLANEOUS) ×3 IMPLANT
SUT CHROMIC 4 0 RB 1X27 (SUTURE) ×1 IMPLANT
SUT MNCRL AB 4-0 PS2 18 (SUTURE) ×1 IMPLANT
SUT SURGILON 0 30 BLK (SUTURE) ×9 IMPLANT
SUT VIC AB 4-0 SH 27 (SUTURE) ×3
SUT VIC AB 4-0 SH 27XANBCTRL (SUTURE) ×2 IMPLANT
SYRINGE 10CC LL (SYRINGE) ×3 IMPLANT

## 2015-11-21 NOTE — Progress Notes (Signed)
Bilateral hearing aids placed in ears. C/O need to void. Urinal provided.

## 2015-11-21 NOTE — Op Note (Signed)
OPERATIVE REPORT  PREOPERATIVE DIAGNOSIS: left inguinal hernia  POSTOPERATIVE DIAGNOSIS:left  inguinal hernia  PROCEDURE:  left inguinal hernia repair  ANESTHESIA:  General  SURGEON:  Rochel Brome M.D.  INDICATIONS: He has had gradual increase in bulging in the left groin. A left inguinal hernia was demonstrated on physical exam and repair was recommended for definitive treatment.  With the patient on the operating table in the supine position the left lower quadrant was prepared with clippers and with ChloraPrep and draped in a sterile manner. A transversely oriented suprapubic incision was made and carried down through subcutaneous tissues. Electrocautery was used for hemostasis. The Scarpa's fascia was incised. The external oblique aponeurosis was incised along the course of its fibers to open the external ring and expose the inguinal cord structures. The cord structures were mobilized. A Penrose drain was passed around the cord structures for traction. Cremaster fibers were spread to expose an indirect hernia sac which was fatty and was approximately 5 cm in length and was followed up to the internal ring. The sac was opened the its continuity with the peritoneal cavity was demonstrated portion of the sac consisted of sigmoid colon. A portion of the sac was ligated with 0 Surgilon suture ligature and amputated and the stump was allowed to retract back into the abdominal cavity. A cord lipoma was dissected free from surrounding structures up to the internal ring and ligated with 4-0 Vicryl and amputated. Another cord lipoma was dissected free from the cord structures up into the internal ring and was ligated with 0 Surgilon suture ligature and amputated. Tissues were not submitted for pathology. The repair was carried out with 0 Surgilon sutures suturing the conjoined tendon to the shelving edge of the inguinal ligament incorporating transversalis fascia into the repair the last stitch led to  satisfactory narrowing of the internal ring.  Bard soft mesh was cut to create an oval shape and was placed over the repair. This was sutured to the repair with interrupted 0 Surgilon sutures and also sutured medially to the deep fascia and on both sides of the internal ring. Next after seeing hemostasis was intact the cord structures were replaced along the floor of the inguinal canal. The cut edges of the external oblique aponeurosis were closed with a running 4-0 Vicryl suture to re-create the external ring. The deep fascia superior and lateral to the repair site was infiltrated with half percent Sensorcaine with epinephrine. Subcutaneous tissues were also infiltrated. The Scarpa's fascia was closed with interrupted 4-0 Vicryl sutures. The skin was closed with running 4-0 Monocryl subcuticular suture and LiquiBand. The testicle remained in the scrotum  The patient appeared to be in satisfactory condition and was prepared for transfer to the recovery room.  Rochel Brome M.D.

## 2015-11-21 NOTE — OR Nursing (Signed)
Several attempts to help patient relax have been rejected.  He continues to clench siderails and talk about his anxiety and shaking of the right side.  He is unsure as to how much pain he has as he is so anxious.  Given Xanax as per Dr. Darliss Ridgel' orders along with an ice pack over incision. Encouraging him to take some oral intake, given applesauce and crackers and juice.  Incision area looks clean and dry.  Foley catheter draining clear/light pink urine.  Instructed his daughter to call office in the am and ask them to schedule a urology appt to remove catheter in a few days.  Dr. Tamala Julian to return with a prescription for xanax at home.  Provided daughter with written instructions for foley care.  Wife trying to help but she is having anxiety and dry heaves because of her husbands condition.  Gave her drink and food and cold cloth and reassurances.

## 2015-11-21 NOTE — Anesthesia Preprocedure Evaluation (Signed)
Anesthesia Evaluation  Patient identified by MRN, date of birth, ID band Patient awake    Reviewed: Allergy & Precautions, NPO status , Patient's Chart, lab work & pertinent test results  History of Anesthesia Complications Negative for: history of anesthetic complications  Airway Mallampati: I       Dental  (+) Partial Lower, Partial Upper   Pulmonary neg pulmonary ROS, former smoker,           Cardiovascular Hypertension: pt denies. + CAD and + Cardiac Stents       Neuro/Psych Depression negative neurological ROS     GI/Hepatic negative GI ROS, Neg liver ROS,   Endo/Other  negative endocrine ROS  Renal/GU negative Renal ROS     Musculoskeletal   Abdominal   Peds  Hematology negative hematology ROS (+)   Anesthesia Other Findings   Reproductive/Obstetrics                             Anesthesia Physical Anesthesia Plan  ASA: III  Anesthesia Plan: General   Post-op Pain Management:    Induction: Intravenous  Airway Management Planned: LMA  Additional Equipment:   Intra-op Plan:   Post-operative Plan:   Informed Consent: I have reviewed the patients History and Physical, chart, labs and discussed the procedure including the risks, benefits and alternatives for the proposed anesthesia with the patient or authorized representative who has indicated his/her understanding and acceptance.     Plan Discussed with:   Anesthesia Plan Comments:         Anesthesia Quick Evaluation

## 2015-11-21 NOTE — Progress Notes (Signed)
Awake. Unable to void in urinal. Wants to stand. Stood at side of bed with assistance from nurse x2. Unable to void. Wants to go to bathroom. C/O pain in bladder. Bladder non-distended.

## 2015-11-21 NOTE — Progress Notes (Signed)
Denture and glasses at bedside.

## 2015-11-21 NOTE — OR Nursing (Signed)
Dr. Tamala Julian here for visited

## 2015-11-21 NOTE — OR Nursing (Signed)
Foley cath inserted and 500cc of clear pink urine returned. He is feeling a bit more comfortable.  Will stay on stretcher for now.

## 2015-11-21 NOTE — Anesthesia Postprocedure Evaluation (Signed)
Anesthesia Post Note  Patient: Chad Sanford  Procedure(s) Performed: Procedure(s) (LRB): HERNIA REPAIR INGUINAL ADULT (Left)  Patient location during evaluation: PACU Anesthesia Type: General Level of consciousness: awake and alert Pain management: pain level controlled Vital Signs Assessment: post-procedure vital signs reviewed and stable Respiratory status: spontaneous breathing, nonlabored ventilation, respiratory function stable and patient connected to nasal cannula oxygen Cardiovascular status: blood pressure returned to baseline and stable Postop Assessment: no signs of nausea or vomiting Anesthetic complications: no    Last Vitals:  Filed Vitals:   11/21/15 1645 11/21/15 1657  BP: 140/78 157/95  Pulse: 80 78  Temp:  36.1 C  Resp: 14 16    Last Pain:  Filed Vitals:   11/21/15 1659  PainSc: 3                  Molli Barrows

## 2015-11-21 NOTE — OR Nursing (Signed)
Bladder Scan revealed 418cc in bladder.  Dr. Tamala Julian notified and received order to place a foley cath.

## 2015-11-21 NOTE — Discharge Instructions (Addendum)
Take Tylenol or Norco if needed for pain.May shower. Avoid straining and heavy lifting   AMBULATORY SURGERY  DISCHARGE INSTRUCTIONS   1) The drugs that you were given will stay in your system until tomorrow so for the next 24 hours you should not:  A) Drive an automobile B) Make any legal decisions C) Drink any alcoholic beverage   2) You may resume regular meals tomorrow.  Today it is better to start with liquids and gradually work up to solid foods.  You may eat anything you prefer, but it is better to start with liquids, then soup and crackers, and gradually work up to solid foods.   3) Please notify your doctor immediately if you have any unusual bleeding, trouble breathing, redness and pain at the surgery site, drainage, fever, or pain not relieved by medication.    4) Additional Instructions:        Please contact your physician with any problems or Same Day Surgery at [phone], Monday through Friday 6 am to 4 pm, or [place] [facility] [place] [facility] number at [phone]. 

## 2015-11-21 NOTE — OR Nursing (Signed)
Patient finally able to relax and be aware of mild discomfort at incision site.  Able to tolerate crackers and fluids.  Up to chair with minimal assistance.  Reviewed post op instructions again. Dr. Tamala Julian in one more time to provide prescriptions for pain medicine and xanax.  Reminded family to provide stool softeners also. In good spirits upon discharge.  Family more comfortable with the plans to go home.

## 2015-11-21 NOTE — Progress Notes (Signed)
Fentanyl 25 mcg IV given for c/o bladder discomfort.

## 2015-11-21 NOTE — H&P (Signed)
He reports no change in condition since the office examination.  He reports the hernia as on the left side. The left side was marked YES  Lab work reviewed  I discussed the plan for left inguinal hernia repair

## 2015-11-21 NOTE — Transfer of Care (Signed)
Immediate Anesthesia Transfer of Care Note  Patient: Chad Sanford  Procedure(s) Performed: Procedure(s): HERNIA REPAIR INGUINAL ADULT (Left)  Patient Location: PACU  Anesthesia Type:General  Level of Consciousness: awake, alert  and oriented  Airway & Oxygen Therapy: Patient Spontanous Breathing and Patient connected to face mask oxygen  Post-op Assessment: Report given to RN and Post -op Vital signs reviewed and stable  Post vital signs: Reviewed and stable  Last Vitals:  Filed Vitals:   11/21/15 1212 11/21/15 1619  BP: 165/97 142/80  Pulse: 86   Temp: 36.6 C 36.3 C  Resp: 18 18    Complications: No apparent anesthesia complications

## 2015-11-21 NOTE — Anesthesia Procedure Notes (Signed)
Procedure Name: LMA Insertion Date/Time: 11/21/2015 2:21 PM Performed by: Doreen Salvage Pre-anesthesia Checklist: Patient identified, Patient being monitored, Timeout performed, Emergency Drugs available and Suction available Patient Re-evaluated:Patient Re-evaluated prior to inductionOxygen Delivery Method: Circle system utilized Preoxygenation: Pre-oxygenation with 100% oxygen Intubation Type: IV induction Ventilation: Mask ventilation without difficulty LMA: LMA inserted LMA Size: 5.0 Tube type: Oral Number of attempts: 1 Placement Confirmation: positive ETCO2 and breath sounds checked- equal and bilateral Tube secured with: Tape Dental Injury: Teeth and Oropharynx as per pre-operative assessment

## 2015-11-21 NOTE — Progress Notes (Signed)
Awake. Continues c/o need to void. Wants to go to [facility]. Transferred to [facility] in good condition.

## 2015-11-22 ENCOUNTER — Encounter: Payer: Self-pay | Admitting: Surgery

## 2015-11-23 ENCOUNTER — Encounter: Payer: Self-pay | Admitting: Surgery

## 2015-11-30 ENCOUNTER — Ambulatory Visit: Payer: Self-pay | Admitting: Obstetrics and Gynecology

## 2016-06-10 DIAGNOSIS — E782 Mixed hyperlipidemia: Secondary | ICD-10-CM | POA: Diagnosis not present

## 2016-06-10 DIAGNOSIS — Z125 Encounter for screening for malignant neoplasm of prostate: Secondary | ICD-10-CM | POA: Diagnosis not present

## 2016-06-10 DIAGNOSIS — Z79899 Other long term (current) drug therapy: Secondary | ICD-10-CM | POA: Diagnosis not present

## 2016-06-17 DIAGNOSIS — E7800 Pure hypercholesterolemia, unspecified: Secondary | ICD-10-CM | POA: Diagnosis not present

## 2016-06-17 DIAGNOSIS — Z0000 Encounter for general adult medical examination without abnormal findings: Secondary | ICD-10-CM | POA: Diagnosis not present

## 2016-06-17 DIAGNOSIS — Z79899 Other long term (current) drug therapy: Secondary | ICD-10-CM | POA: Diagnosis not present

## 2016-10-17 DIAGNOSIS — K4091 Unilateral inguinal hernia, without obstruction or gangrene, recurrent: Secondary | ICD-10-CM | POA: Diagnosis not present

## 2016-10-23 ENCOUNTER — Ambulatory Visit: Payer: PPO | Admitting: Urology

## 2016-10-23 ENCOUNTER — Encounter: Payer: Self-pay | Admitting: Urology

## 2016-10-23 VITALS — BP 157/85 | HR 79 | Ht 73.0 in | Wt 204.3 lb

## 2016-10-23 DIAGNOSIS — N400 Enlarged prostate without lower urinary tract symptoms: Secondary | ICD-10-CM

## 2016-10-23 DIAGNOSIS — R3129 Other microscopic hematuria: Secondary | ICD-10-CM

## 2016-10-23 DIAGNOSIS — R339 Retention of urine, unspecified: Secondary | ICD-10-CM | POA: Diagnosis not present

## 2016-10-23 LAB — MICROSCOPIC EXAMINATION
Bacteria, UA: NONE SEEN
EPITHELIAL CELLS (NON RENAL): NONE SEEN /HPF (ref 0–10)
WBC UA: NONE SEEN /HPF (ref 0–?)

## 2016-10-23 LAB — URINALYSIS, COMPLETE
BILIRUBIN UA: NEGATIVE
GLUCOSE, UA: NEGATIVE
KETONES UA: NEGATIVE
LEUKOCYTES UA: NEGATIVE
Nitrite, UA: NEGATIVE
Protein, UA: NEGATIVE
SPEC GRAV UA: 1.015 (ref 1.005–1.030)
Urobilinogen, Ur: 0.2 mg/dL (ref 0.2–1.0)
pH, UA: 7 (ref 5.0–7.5)

## 2016-10-23 LAB — BLADDER SCAN AMB NON-IMAGING: SCAN RESULT: 88

## 2016-10-23 NOTE — Progress Notes (Signed)
10/23/2016 2:53 PM   Chad Sanford 09-30-1934 [id number]  Referring provider: Derinda Late, MD [phone] [facility] [place], [place] [address]  Chief Complaint  Patient presents with  . New Patient (Initial Visit)    trouble emptying bladder     HPI: The patient is an 83 year old gentleman with a past medical history of BPH presents today for evaluation of his history of urinary retention. He is scheduled for left inguinal hernia repair in the near future, and he is here for evaluation as he has developed urinary retention in the past after surgery. At that time, he was started on Flomax for a short duration which resolve his issue with urinary retention. He stopped that medication return to his baseline urinary status the patient reports is good. He has nocturia 1. He feels he has a strong stream and empties his bladder. He is overall happy with his urinary quality of life. His PVR in our office today is 88 cc.   PMH: Past Medical History:  Diagnosis Date  . BPH (benign prostatic hyperplasia)   . Chest pain, unspecified    exertional  . Coronary atherosclerosis of unspecified type of vessel, native or graft    unspecified site, stent put in   . Depression   . Elevated prostate specific antigen (PSA)   . Other and unspecified hyperlipidemia   . Special screening for malignant neoplasm of prostate     Surgical History: Past Surgical History:  Procedure Laterality Date  . CATARACT EXTRACTION W/ INTRAOCULAR LENS  IMPLANT, BILATERAL Bilateral   . CORONARY STENT PLACEMENT     CAD  . ESOPHAGOGASTRODUODENOSCOPY N/A 08/06/2013   Procedure: ESOPHAGOGASTRODUODENOSCOPY (EGD);  Surgeon: Wonda Horner, MD;  Location[facility] ENDOSCOPY;  Service: Endoscopy;  Laterality: N/A;  . INGUINAL HERNIA REPAIR Left 11/21/2015   Procedure: HERNIA REPAIR INGUINAL ADULT;  Surgeon: Leonie Green, MD;  Location: [facility] ORS;  Service: General;  Laterality:  Left;  . TONSILLECTOMY    . TONSILLECTOMY      Home Medications:  Allergies as of 10/23/2016      Reactions   Aspirin    REACTION: blood in urine   Plavix [clopidogrel Bisulfate] Other (See Comments)   Bleeding   Statins Other (See Comments)   Muscle weakness, loss of hair      Medication List       Accurate as of 10/23/16  2:53 PM. Always use your most recent med list.          ALPRAZolam 0.5 MG tablet Commonly known as:  XANAX Take 1 tablet (0.5 mg total) by mouth 3 (three) times daily as needed for anxiety.   fish oil-omega-3 fatty acids 1000 MG capsule Take 1 g by mouth 2 (two) times daily.   Ibuprofen 200 MG Caps Take 200 mg by mouth daily as needed (pain).   multivitamin per tablet Take 1 tablet by mouth daily.   nitroGLYCERIN 0.4 MG SL tablet Commonly known as:  NITROSTAT Place 0.4 mg under the tongue every 5 (five) minutes as needed for chest pain. Reported on 11/21/2015   tamsulosin 0.4 MG Caps capsule Commonly known as:  FLOMAX Take 0.4 mg by mouth.   VITAMIN B 12 PO Take 1 tablet by mouth daily.   ZETIA 10 MG tablet Generic drug:  ezetimibe TAKE 1 TABLET BY MOUTH ONCE A DAY       Allergies:  Allergies  Allergen Reactions  . Aspirin  REACTION: blood in urine  . Plavix [Clopidogrel Bisulfate] Other (See Comments)    Bleeding   . Statins Other (See Comments)    Muscle weakness, loss of hair    Family History: Family History  Problem Relation Age of Onset  . Stroke Mother 80  . Stroke Father 77    Social History:  reports that he has quit smoking. He does not have any smokeless tobacco history on file. He reports that he does not drink alcohol or use drugs.  ROS: UROLOGY Frequent Urination?: No Hard to postpone urination?: Yes Burning/pain with urination?: No Get up at night to urinate?: Yes Leakage of urine?: No Urine stream starts and stops?: Yes Trouble starting stream?: No Do you have to strain to urinate?: No Blood in  urine?: No Urinary tract infection?: No Sexually transmitted disease?: No Injury to kidneys or bladder?: No Painful intercourse?: No Weak stream?: Yes Erection problems?: Yes Penile pain?: No                                      Physical Exam: BP (!) 157/85   Pulse 79   Ht 6\' 1"  (1.854 m)   Wt 204 lb 4.8 oz (92.7 kg)   BMI 26.95 kg/m   Constitutional:  Alert and oriented, No acute distress. HEENT: [place] AT, moist mucus membranes.  Trachea midline, no masses. Cardiovascular: No clubbing, cyanosis, or edema. Respiratory: Normal respiratory effort, no increased work of breathing. GI: Abdomen is soft, nontender, nondistended, no abdominal masses GU: No CVA tenderness.  Skin: No rashes, bruises or suspicious lesions. Lymph: No cervical or inguinal adenopathy. Neurologic: Grossly intact, no focal deficits, moving all 4 extremities. Psychiatric: Normal mood and affect.  Laboratory Data: Lab Results  Component Value Date   WBC 7.2 03/14/2014   HGB 15.1 03/14/2014   HCT 45.0 03/14/2014   MCV 89.8 03/14/2014   PLT 274 03/14/2014    Lab Results  Component Value Date   CREATININE 0.94 03/14/2014    Lab Results  Component Value Date   PSA 2.83 08/23/2009   PSA 4.45 (H) 06/28/2009   PSA 2.74 01/19/2009    No results found for: TESTOSTERONE  Lab Results  Component Value Date   HGBA1C 5.8 06/28/2009    Urinalysis No results found for: COLORURINE, APPEARANCEUR, LABSPEC, PHURINE, GLUCOSEU, HGBUR, BILIRUBINUR, KETONESUR, PROTEINUR, UROBILINOGEN, NITRITE, LEUKOCYTESUR  Assessment & Plan:    1. BPH 2. History of urinary retention As the patient does not have any urinary issues that bother him at baseline, I do not recommend starting a new medication at this time, and the patient is not interested in starting any new medications at this time either. If at the time of surgery he develops urinary retention, we can start him on Flomax at that time.  He will  otherwise follow with [place] as needed.   Return if symptoms worsen or fail to improve.  Nickie Retort, MD  [facility] [address] [place], [place] [address] [phone]

## 2016-11-01 ENCOUNTER — Encounter
Admission: RE | Admit: 2016-11-01 | Discharge: 2016-11-01 | Disposition: A | Discharge status: Home / Self Care | Payer: PPO | Source: Ambulatory Visit | Attending: Surgery | Admitting: Surgery

## 2016-11-01 DIAGNOSIS — Z01818 Encounter for other preprocedural examination: Secondary | ICD-10-CM | POA: Insufficient documentation

## 2016-11-01 DIAGNOSIS — K4091 Unilateral inguinal hernia, without obstruction or gangrene, recurrent: Secondary | ICD-10-CM | POA: Diagnosis not present

## 2016-11-01 DIAGNOSIS — Z01812 Encounter for preprocedural laboratory examination: Secondary | ICD-10-CM | POA: Diagnosis not present

## 2016-11-01 HISTORY — DX: Adverse effect of unspecified anesthetic, initial encounter: T41.45XA

## 2016-11-01 HISTORY — DX: Gout, unspecified: M10.9

## 2016-11-01 HISTORY — DX: Other complications of anesthesia, initial encounter: T88.59XA

## 2016-11-01 LAB — COMPREHENSIVE METABOLIC PANEL
ALBUMIN: 4.6 g/dL (ref 3.5–5.0)
ALK PHOS: 49 U/L (ref 38–126)
ALT: 19 U/L (ref 17–63)
ANION GAP: 7 (ref 5–15)
AST: 27 U/L (ref 15–41)
BUN: 17 mg/dL (ref 6–20)
CO2: 26 mmol/L (ref 22–32)
Calcium: 9.5 mg/dL (ref 8.9–10.3)
Chloride: 107 mmol/L (ref 101–111)
Creatinine, Ser: 1.01 mg/dL (ref 0.61–1.24)
GFR calc Af Amer: >60 mL/min (ref >60)
GFR calc non Af Amer: >60 mL/min (ref >60)
GLUCOSE: 134 mg/dL — AB (ref 65–99)
Potassium: 4.1 mmol/L (ref 3.5–5.1)
SODIUM: 140 mmol/L (ref 135–145)
Total Bilirubin: 1.5 mg/dL — ABNORMAL HIGH (ref 0.3–1.2)
Total Protein: 7.4 g/dL (ref 6.5–8.1)

## 2016-11-01 LAB — CBC
HCT: 43.7 % (ref 40.0–52.0)
HEMOGLOBIN: 15.2 g/dL (ref 13.0–18.0)
MCH: 30.6 pg (ref 26.0–34.0)
MCHC: 34.6 g/dL (ref 32.0–36.0)
MCV: 88.3 fL (ref 80.0–100.0)
Platelets: 280 10*3/uL (ref 150–440)
RBC: 4.95 MIL/uL (ref 4.40–5.90)
RDW: 13.1 % (ref 11.5–14.5)
WBC: 6.7 10*3/uL (ref 3.8–10.6)

## 2016-11-01 NOTE — Patient Instructions (Signed)
  Your procedure is scheduled on: 11/08/16 Fri Report to Same Day Surgery 2nd floor medical mall [facility] Entrance-take elevator on left to 2nd floor.  Check in with surgery information desk.) To find out your arrival time please call [phone] between 1PM - 3PM on 11/07/16 Thurs  Remember: Instructions that are not followed completely may result in serious medical risk, up to and including death, or upon the discretion of your surgeon and anesthesiologist your surgery may need to be rescheduled.    _x___ 1. Do not eat food or drink liquids after midnight. No gum chewing or hard candies.     __x__ 2. No Alcohol for 24 hours before or after surgery.   __x__3. No Smoking for 24 prior to surgery.   ____  4. Bring all medications with you on the day of surgery if instructed.    __x__ 5. Notify your doctor if there is any change in your medical condition     (cold, fever, infections).     Do not wear jewelry, make-up, hairpins, clips or nail polish.  Do not wear lotions, powders, or perfumes. You may wear deodorant.  Do not shave 48 hours prior to surgery. Men may shave face and neck.  Do not bring valuables to the hospital.    [facility] is not responsible for any belongings or valuables.               Contacts, dentures or bridgework may not be worn into surgery.  Leave your suitcase in the car. After surgery it may be brought to your room.  For patients admitted to the hospital, discharge time is determined by your treatment team.   Patients discharged the day of surgery will not be allowed to drive home.  You will need someone to drive you home and stay with you the night of your procedure.    Please read over the following fact sheets that you were given:   [facility] Preparing for Surgery and or MRSA Information   _x___ Take these medicines the morning of surgery with A SIP OF WATER:    1. None  2.  3.  4.  5.  6.  ____Fleets enema or Magnesium Citrate as  directed.   _x___ Use CHG Soap or sage wipes as directed on instruction sheet   ____ Use inhalers on the day of surgery and bring to hospital day of surgery  ____ Stop metformin 2 days prior to surgery    ____ Take 1/2 of usual insulin dose the night before surgery and none on the morning of           surgery.   ____ Stop Aspirin, Coumadin, Pllavix ,Eliquis, Effient, or Pradaxa  x__ Stop Anti-inflammatories such as Advil, Aleve, Ibuprofen, Motrin, Naproxen,          Naprosyn, Goodies powders or aspirin products. Ok to take Tylenol.  Stop aspirin-sod bicarb-citric acid (ALKA-SELTZER) and Ibuprofen today   _x___ Stop supplements until after surgery.  Stop fish oils  ____ Bring C-Pap to the hospital.

## 2016-11-08 ENCOUNTER — Ambulatory Visit
Admission: RE | Admit: 2016-11-08 | Discharge: 2016-11-08 | Disposition: A | Discharge status: Home / Self Care | Payer: PPO | Source: Ambulatory Visit | Attending: Surgery | Admitting: Surgery

## 2016-11-08 ENCOUNTER — Encounter: Payer: Self-pay | Admitting: *Deleted

## 2016-11-08 ENCOUNTER — Encounter
Admission: RE | Disposition: A | Discharge status: Home / Self Care | Payer: Self-pay | Source: Ambulatory Visit | Attending: Surgery

## 2016-11-08 ENCOUNTER — Ambulatory Visit: Payer: PPO | Admitting: Anesthesiology

## 2016-11-08 DIAGNOSIS — F329 Major depressive disorder, single episode, unspecified: Secondary | ICD-10-CM | POA: Diagnosis not present

## 2016-11-08 DIAGNOSIS — K4091 Unilateral inguinal hernia, without obstruction or gangrene, recurrent: Secondary | ICD-10-CM | POA: Insufficient documentation

## 2016-11-08 DIAGNOSIS — I2510 Atherosclerotic heart disease of native coronary artery without angina pectoris: Secondary | ICD-10-CM | POA: Insufficient documentation

## 2016-11-08 DIAGNOSIS — K4090 Unilateral inguinal hernia, without obstruction or gangrene, not specified as recurrent: Secondary | ICD-10-CM | POA: Diagnosis not present

## 2016-11-08 DIAGNOSIS — I10 Essential (primary) hypertension: Secondary | ICD-10-CM | POA: Insufficient documentation

## 2016-11-08 DIAGNOSIS — Z951 Presence of aortocoronary bypass graft: Secondary | ICD-10-CM | POA: Insufficient documentation

## 2016-11-08 DIAGNOSIS — Z87891 Personal history of nicotine dependence: Secondary | ICD-10-CM | POA: Diagnosis not present

## 2016-11-08 HISTORY — PX: INGUINAL HERNIA REPAIR: SHX194

## 2016-11-08 SURGERY — REPAIR, HERNIA, INGUINAL, ADULT
Anesthesia: General | Site: Abdomen | Laterality: Left | Wound class: Clean

## 2016-11-08 MED ORDER — FENTANYL CITRATE (PF) 100 MCG/2ML IJ SOLN
INTRAMUSCULAR | Status: AC
  Administered 2016-11-08: 25 ug via INTRAVENOUS
  Filled 2016-11-08: qty 2

## 2016-11-08 MED ORDER — HYDROCODONE-ACETAMINOPHEN 5-325 MG PO TABS
ORAL_TABLET | ORAL | Status: DC
Start: 2016-11-08 — End: 2016-11-08
  Filled 2016-11-08: qty 1

## 2016-11-08 MED ORDER — PHENYLEPHRINE 40 MCG/ML (10ML) SYRINGE FOR IV PUSH (FOR BLOOD PRESSURE SUPPORT)
PREFILLED_SYRINGE | INTRAVENOUS | Status: AC
  Filled 2016-11-08: qty 10

## 2016-11-08 MED ORDER — ROCURONIUM BROMIDE 100 MG/10ML IV SOLN
INTRAVENOUS | Status: DC | PRN
  Administered 2016-11-08: 30 mg via INTRAVENOUS
  Administered 2016-11-08 (×2): 20 mg via INTRAVENOUS

## 2016-11-08 MED ORDER — BUPIVACAINE-EPINEPHRINE (PF) 0.5% -1:200000 IJ SOLN
INTRAMUSCULAR | Status: DC | PRN
  Administered 2016-11-08: 10 mL via PERINEURAL

## 2016-11-08 MED ORDER — HYDROCODONE-ACETAMINOPHEN 5-325 MG PO TABS: 1.0000 | ORAL_TABLET | ORAL | 0 refills | Status: AC | PRN

## 2016-11-08 MED ORDER — SUGAMMADEX SODIUM 200 MG/2ML IV SOLN
INTRAVENOUS | Status: AC
  Filled 2016-11-08: qty 2

## 2016-11-08 MED ORDER — FENTANYL CITRATE (PF) 100 MCG/2ML IJ SOLN
INTRAMUSCULAR | Status: AC
  Filled 2016-11-08: qty 2

## 2016-11-08 MED ORDER — PHENYLEPHRINE HCL 10 MG/ML IJ SOLN
INTRAMUSCULAR | Status: DC | PRN
  Administered 2016-11-08: 80 ug via INTRAVENOUS
  Administered 2016-11-08: 120 ug via INTRAVENOUS
  Administered 2016-11-08 (×2): 80 ug via INTRAVENOUS
  Administered 2016-11-08: 120 ug via INTRAVENOUS

## 2016-11-08 MED ORDER — FENTANYL CITRATE (PF) 100 MCG/2ML IJ SOLN
INTRAMUSCULAR | Status: DC | PRN
  Administered 2016-11-08 (×2): 50 ug via INTRAVENOUS

## 2016-11-08 MED ORDER — ROCURONIUM BROMIDE 50 MG/5ML IV SOSY
PREFILLED_SYRINGE | INTRAVENOUS | Status: AC
  Filled 2016-11-08: qty 5

## 2016-11-08 MED ORDER — LIDOCAINE HCL (PF) 2 % IJ SOLN
INTRAMUSCULAR | Status: AC
  Filled 2016-11-08: qty 2

## 2016-11-08 MED ORDER — BUPIVACAINE HCL (PF) 0.5 % IJ SOLN
INTRAMUSCULAR | Status: AC
  Filled 2016-11-08: qty 30

## 2016-11-08 MED ORDER — CEFAZOLIN SODIUM-DEXTROSE 2-4 GM/100ML-% IV SOLN
INTRAVENOUS | Status: AC
  Administered 2016-11-08: 2 g via INTRAVENOUS
  Filled 2016-11-08: qty 100

## 2016-11-08 MED ORDER — MIDAZOLAM HCL 2 MG/2ML IJ SOLN
INTRAMUSCULAR | Status: AC
  Filled 2016-11-08: qty 2

## 2016-11-08 MED ORDER — SUGAMMADEX SODIUM 200 MG/2ML IV SOLN
INTRAVENOUS | Status: DC | PRN
  Administered 2016-11-08: 181.4 mg via INTRAVENOUS

## 2016-11-08 MED ORDER — ONDANSETRON HCL 4 MG/2ML IJ SOLN: 4.0000 mg | Freq: Once | INTRAMUSCULAR | Status: DC | PRN

## 2016-11-08 MED ORDER — HYDROCODONE-ACETAMINOPHEN 5-325 MG PO TABS
1.0000 | ORAL_TABLET | ORAL | Status: DC | PRN
  Administered 2016-11-08 (×2): 1 via ORAL

## 2016-11-08 MED ORDER — FAMOTIDINE 20 MG PO TABS
ORAL_TABLET | ORAL | Status: AC
  Administered 2016-11-08: 20 mg via ORAL
  Filled 2016-11-08: qty 1

## 2016-11-08 MED ORDER — FAMOTIDINE 20 MG PO TABS
20.0000 mg | ORAL_TABLET | Freq: Once | ORAL | Status: AC
  Administered 2016-11-08: 20 mg via ORAL

## 2016-11-08 MED ORDER — LIDOCAINE HCL (CARDIAC) 20 MG/ML IV SOLN
INTRAVENOUS | Status: DC | PRN
  Administered 2016-11-08: 100 mg via INTRAVENOUS

## 2016-11-08 MED ORDER — LACTATED RINGERS IV SOLN
INTRAVENOUS | Status: DC
  Administered 2016-11-08 (×2): via INTRAVENOUS

## 2016-11-08 MED ORDER — FENTANYL CITRATE (PF) 100 MCG/2ML IJ SOLN
25.0000 ug | INTRAMUSCULAR | Status: DC | PRN
  Administered 2016-11-08 (×3): 25 ug via INTRAVENOUS

## 2016-11-08 MED ORDER — LIDOCAINE HCL 2 % EX GEL
CUTANEOUS | Status: AC
  Filled 2016-11-08: qty 5

## 2016-11-08 MED ORDER — PROPOFOL 10 MG/ML IV BOLUS
INTRAVENOUS | Status: AC
  Filled 2016-11-08: qty 20

## 2016-11-08 MED ORDER — PROPOFOL 10 MG/ML IV BOLUS
INTRAVENOUS | Status: DC | PRN
  Administered 2016-11-08: 100 mg via INTRAVENOUS

## 2016-11-08 MED ORDER — BUPIVACAINE-EPINEPHRINE (PF) 0.25% -1:200000 IJ SOLN
INTRAMUSCULAR | Status: AC
  Filled 2016-11-08: qty 30

## 2016-11-08 MED ORDER — CEFAZOLIN SODIUM-DEXTROSE 2-4 GM/100ML-% IV SOLN
2.0000 g | Freq: Once | INTRAVENOUS | Status: AC
  Administered 2016-11-08: 2 g via INTRAVENOUS

## 2016-11-08 MED ORDER — EPINEPHRINE PF 1 MG/ML IJ SOLN
INTRAMUSCULAR | Status: AC
  Filled 2016-11-08: qty 1

## 2016-11-08 SURGICAL SUPPLY — 28 items
ADH SKN CLS APL DERMABOND .7 (GAUZE/BANDAGES/DRESSINGS) ×1
BLADE SURG 15 STRL LF DISP TIS (BLADE) ×1 IMPLANT
BLADE SURG 15 STRL SS (BLADE) ×3
CANISTER SUCT 1200ML W/VALVE (MISCELLANEOUS) ×3 IMPLANT
CHLORAPREP W/TINT 26ML (MISCELLANEOUS) ×3 IMPLANT
DERMABOND ADVANCED (GAUZE/BANDAGES/DRESSINGS) ×2
DERMABOND ADVANCED .7 DNX12 (GAUZE/BANDAGES/DRESSINGS) ×1 IMPLANT
DRAIN PENROSE 5/8X18 LTX STRL (WOUND CARE) ×3 IMPLANT
DRAPE LAPAROTOMY 77X122 PED (DRAPES) ×3 IMPLANT
ELECT REM PT RETURN 9FT ADLT (ELECTROSURGICAL) ×3
ELECTRODE REM PT RTRN 9FT ADLT (ELECTROSURGICAL) ×1 IMPLANT
GLOVE BIO SURGEON STRL SZ7.5 (GLOVE) ×3 IMPLANT
GOWN STRL REUS W/ TWL LRG LVL3 (GOWN DISPOSABLE) ×3 IMPLANT
GOWN STRL REUS W/TWL LRG LVL3 (GOWN DISPOSABLE) ×9
KIT RM TURNOVER STRD PROC AR (KITS) ×3 IMPLANT
LABEL OR SOLS (LABEL) ×3 IMPLANT
MESH SYNTHETIC 4X6 SOFT BARD (Mesh General) ×1 IMPLANT
MESH SYNTHETIC SOFT BARD 4X6 (Mesh General) ×2 IMPLANT
NDL HYPO 25X1 1.5 SAFETY (NEEDLE) ×1 IMPLANT
NEEDLE HYPO 25X1 1.5 SAFETY (NEEDLE) ×3 IMPLANT
NS IRRIG 500ML POUR BTL (IV SOLUTION) ×3 IMPLANT
PACK BASIN MINOR ARMC (MISCELLANEOUS) ×3 IMPLANT
SUT CHROMIC 4 0 RB 1X27 (SUTURE) ×3 IMPLANT
SUT MNCRL AB 4-0 PS2 18 (SUTURE) ×3 IMPLANT
SUT SURGILON 0 30 BLK (SUTURE) ×9 IMPLANT
SUT VIC AB 4-0 SH 27 (SUTURE) ×6
SUT VIC AB 4-0 SH 27XANBCTRL (SUTURE) ×2 IMPLANT
SYRINGE 10CC LL (SYRINGE) ×3 IMPLANT

## 2016-11-08 NOTE — Anesthesia Preprocedure Evaluation (Signed)
Anesthesia Evaluation  Patient identified by MRN, date of birth, ID band Patient awake    Reviewed: Allergy & Precautions, NPO status , Patient's Chart, lab work & pertinent test results  History of Anesthesia Complications Negative for: history of anesthetic complications  Airway Mallampati: I       Dental  (+) Partial Lower, Partial Upper   Pulmonary neg pulmonary ROS, former smoker,    Pulmonary exam normal        Cardiovascular hypertension, Pt. on medications + CAD and + Cardiac Stents  Normal cardiovascular exam     Neuro/Psych PSYCHIATRIC DISORDERS Depression negative neurological ROS     GI/Hepatic negative GI ROS, Neg liver ROS,   Endo/Other  negative endocrine ROS  Renal/GU negative Renal ROS  negative genitourinary   Musculoskeletal negative musculoskeletal ROS (+)   Abdominal Normal abdominal exam  (+)   Peds negative pediatric ROS (+)  Hematology negative hematology ROS (+)   Anesthesia Other Findings   Reproductive/Obstetrics                             Anesthesia Physical  Anesthesia Plan  ASA: III  Anesthesia Plan: General   Post-op Pain Management:    Induction: Intravenous  Airway Management Planned: Oral ETT  Additional Equipment:   Intra-op Plan:   Post-operative Plan: Extubation in OR  Informed Consent: I have reviewed the patients History and Physical, chart, labs and discussed the procedure including the risks, benefits and alternatives for the proposed anesthesia with the patient or authorized representative who has indicated his/her understanding and acceptance.   Dental advisory given  Plan Discussed with: CRNA and Surgeon  Anesthesia Plan Comments:         Anesthesia Quick Evaluation

## 2016-11-08 NOTE — Discharge Instructions (Signed)
Take Tylenol or Norco if needed for pain. ° °Should not drive or do anything dangerous when taking Norco. ° °May shower and blot dry. ° °Avoid straining and heavy lifting. ° °AMBULATORY SURGERY  °DISCHARGE INSTRUCTIONS ° ° °1) The drugs that you were given will stay in your system until tomorrow so for the next 24 hours you should not: ° °A) Drive an automobile °B) Make any legal decisions °C) Drink any alcoholic beverage ° ° °2) You may resume regular meals tomorrow.  Today it is better to start with liquids and gradually work up to solid foods. ° °You may eat anything you prefer, but it is better to start with liquids, then soup and crackers, and gradually work up to solid foods. ° ° °3) Please notify your doctor immediately if you have any unusual bleeding, trouble breathing, redness and pain at the surgery site, drainage, fever, or pain not relieved by medication. ° °4) Additional Instructions: ° ° °Please contact your physician with any problems or Same Day Surgery at [phone], Monday through Friday 6 am to 4 pm, or [facility] number at [phone]. °

## 2016-11-08 NOTE — Transfer of Care (Signed)
Immediate Anesthesia Transfer of Care Note  Patient: Chad Sanford  Procedure(s) Performed: Procedure(s): HERNIA REPAIR INGUINAL ADULT (Left)  Patient Location: PACU  Anesthesia Type:General  Level of Consciousness: awake  Airway & Oxygen Therapy: Patient Spontanous Breathing and Patient connected to face mask oxygen  Post-op Assessment: Report given to RN and Post -op Vital signs reviewed and stable  Post vital signs: Reviewed and stable  Last Vitals:  Vitals:   11/08/16 0559 11/08/16 0912  BP: (!) 167/90   Pulse: 85   Resp: 20   Temp: 37 C (P) 36.2 C    Last Pain:  Vitals:   11/08/16 0559  TempSrc: Oral         Complications: No apparent anesthesia complications

## 2016-11-08 NOTE — Anesthesia Postprocedure Evaluation (Signed)
Anesthesia Post Note  Patient: Chad Sanford  Procedure(s) Performed: Procedure(s) (LRB): HERNIA REPAIR INGUINAL ADULT (Left)  Patient location during evaluation: PACU Anesthesia Type: General Level of consciousness: awake and alert and oriented Pain management: pain level controlled Vital Signs Assessment: post-procedure vital signs reviewed and stable Respiratory status: spontaneous breathing Cardiovascular status: blood pressure returned to baseline Anesthetic complications: no     Last Vitals:  Vitals:   11/08/16 1030 11/08/16 1225  BP: (!) 167/75 (!) 173/88  Pulse: 78 89  Resp: 16 16  Temp: 36.5 C     Last Pain:  Vitals:   11/08/16 1225  TempSrc:   PainSc: 3                  Jaiveon Suppes

## 2016-11-08 NOTE — Anesthesia Procedure Notes (Addendum)
Procedure Name: Intubation Date/Time: 11/08/2016 7:43 AM Performed by: Allean Found Pre-anesthesia Checklist: Patient identified, Emergency Drugs available, Suction available, Patient being monitored and Timeout performed Patient Re-evaluated:Patient Re-evaluated prior to inductionOxygen Delivery Method: Circle system utilized Preoxygenation: Pre-oxygenation with 100% oxygen Intubation Type: IV induction Ventilation: Mask ventilation without difficulty Laryngoscope Size: Mac, McGraph and 4 Grade View: Grade II Tube type: Oral Tube size: 7.5 mm Number of attempts: 2 Airway Equipment and Method: Stylet Placement Confirmation: ETT inserted through vocal cords under direct vision,  positive ETCO2 and breath sounds checked- equal and bilateral Secured at: 21 cm Dental Injury: Teeth and Oropharynx as per pre-operative assessment  Comments: DL #4 with MAC 4, viewed only arytenoids.  Tried blind pass, no ETCO2, easy mask with oral airway, moved to [place] with #4 cover, glottis came into view easily.

## 2016-11-08 NOTE — Consult Note (Signed)
  He reports no change in condition since office exam  Discussed plan for left inguinal hernia repair.  Left side marked YES

## 2016-11-08 NOTE — Anesthesia Post-op Follow-up Note (Cosign Needed)
Anesthesia QCDR form completed.        

## 2016-11-08 NOTE — Op Note (Signed)
OPERATIVE REPORT  PREOPERATIVE DIAGNOSIS: Recurrent left inguinal hernia  POSTOPERATIVE DIAGNOSIS: Recurrent left  inguinal hernia  PROCEDURE: Recurrent left inguinal hernia repair  ANESTHESIA:  General  SURGEON:  Rochel Brome M.D.  INDICATIONS: He has history of left and the hernia repair done January 2017. Did have a sliding type hernia. Mesh was used in the repair. He recently presented to the office with recurrent bulging and had physical findings of recurrent left inguinal hernia. Surgery was recommended for definitive treatment.  With the patient on the operating table in the supine position the left lower quadrant was prepared with clippers and with ChloraPrep and draped in a sterile manner. A transversely oriented suprapubic incision was made at the site of the old scar and carried down through subcutaneous tissues. Electrocautery was used for hemostasis. The Scarpa's fascia was incised. The external oblique aponeurosis was incised along the course of its fibers to open the external ring and expose the inguinal cord structures. There was scar tissue found during the dissection. The cord structures were mobilized. A Penrose drain was passed around the cord structures for traction. A hernia sac was found at the internal ring just above the cord structures. This sac was dissected free from surrounding structures and was approximately 6 cm in length. It appeared to be a sliding-type hernia. Circumferential dissection was carried out as it was separated from the fascial ring defect. The sac with its contents was reduced back into the abdominal cavity.  Bard soft mesh was cut to create an oval shape with a slot cut out to straddle the cord structures. It was placed into the properitoneal plane. It was sutured to the overlying fascia medially and laterally and proximally with through and through 0 Surgilon sutures. The fascial ring defect was closed with interrupted 0 Surgilon sutures.   Next  after seeing hemostasis was intact the cord structures were replaced along the floor of the inguinal canal. The cut edges of the external oblique aponeurosis were closed with a running 4-0 Vicryl suture to re-create the external ring. The deep fascia superior and lateral to the repair site was infiltrated with half percent Sensorcaine with epinephrine. Subcutaneous tissues were also infiltrated. The Scarpa's fascia was closed with interrupted 4-0 Vicryl sutures. The skin was closed with running 4-0 Monocryl subcuticular suture and LiquiBand. The testicle remained in the scrotum  The patient appeared to be in satisfactory condition and was prepared for transfer to the recovery room.  Rochel Brome M.D.

## 2016-11-18 ENCOUNTER — Encounter: Payer: Self-pay | Admitting: Surgery

## 2017-06-12 DIAGNOSIS — E782 Mixed hyperlipidemia: Secondary | ICD-10-CM | POA: Diagnosis not present

## 2017-06-12 DIAGNOSIS — Z79899 Other long term (current) drug therapy: Secondary | ICD-10-CM | POA: Diagnosis not present

## 2017-06-12 DIAGNOSIS — Z125 Encounter for screening for malignant neoplasm of prostate: Secondary | ICD-10-CM | POA: Diagnosis not present

## 2017-06-19 DIAGNOSIS — Z0000 Encounter for general adult medical examination without abnormal findings: Secondary | ICD-10-CM | POA: Diagnosis not present

## 2017-06-19 DIAGNOSIS — R918 Other nonspecific abnormal finding of lung field: Secondary | ICD-10-CM | POA: Diagnosis not present

## 2018-09-24 DIAGNOSIS — Z125 Encounter for screening for malignant neoplasm of prostate: Secondary | ICD-10-CM | POA: Diagnosis not present

## 2018-09-24 DIAGNOSIS — R739 Hyperglycemia, unspecified: Secondary | ICD-10-CM | POA: Diagnosis not present

## 2018-09-24 DIAGNOSIS — Z79899 Other long term (current) drug therapy: Secondary | ICD-10-CM | POA: Diagnosis not present

## 2018-09-24 DIAGNOSIS — E7800 Pure hypercholesterolemia, unspecified: Secondary | ICD-10-CM | POA: Diagnosis not present

## 2018-10-01 DIAGNOSIS — Z0000 Encounter for general adult medical examination without abnormal findings: Secondary | ICD-10-CM | POA: Diagnosis not present

## 2022-01-03 ENCOUNTER — Ambulatory Visit: Payer: Self-pay | Admitting: *Deleted

## 2022-01-03 ENCOUNTER — Telehealth: Payer: Self-pay | Admitting: Family Medicine

## 2022-01-03 NOTE — Telephone Encounter (Signed)
Pt  wife has been called twice regard triage today and states that she has already got someone for him to disregard note. No longer needs call ?

## 2022-01-03 NOTE — Telephone Encounter (Signed)
Summary: Spot on chest that is draining  ? Pt has a big sore on his chest that is draining, pt's wife is concerned and wants to speak to a nurse  ? ?Best contact: [phone]   ?  ? ? ? ?Chief Complaint: requesting medication for lump on chest draining. Requesting appt  ?Symptoms: boil, looks like pimple noted on right chest area. Draining "milky" drainage, stinging at times.  ?Frequency: na  ?Pertinent Negatives: Patient denies difficulty breathing, no fever, no weakness, no rash.  ?Disposition: '[]'$ ED /'[x]'$ Urgent Care (no appt availability in office) / '[]'$ Appointment(In office/virtual)/ '[]'$  [facility]/ '[]'$ Home Care/ '[]'$ Refused Recommended Disposition /'[]'$ [facility]/ '[x]'$  Follow-up with PCP ?Additional Notes:  ? ?Recommended to contact PCP. NT unable to schedule appt with PCP. Recommended to go to UC for evaluation within 24 hours if unable to see PCP. Offered online virtual UC visit and patient is 20 and requesting to be seen in person.  [phone] to UC at [facility].   ? ? ? ? ?Reason for Disposition ? [1] Boil > 1/2 inch across (> 12 mm; larger than a marble) AND [2] center is soft or pus colored ? ?Answer Assessment - Initial Assessment Questions ?1. APPEARANCE of BOIL: "What does the boil look like?"  ?    Looks like pimple , cyst  ?2. LOCATION: "Where is the boil located?"  ?    Right chest area  ?3. NUMBER: "How many boils are there?"  ?    X 1  ?4. SIZE: "How big is the boil?" (e.g., inches, cm; compare to size of a coin or other object) ?    Na  ?5. ONSET: "When did the boil start?" ?    Na  ?6. PAIN: "Is there any pain?" If Yes, ask: "How bad is the pain?"   (Scale 1-10; or mild, moderate, severe) ?    Na  ?7. FEVER: "Do you have a fever?" If Yes, ask: "What is it, how was it measured, and when did it start?"  ?    Denies  ?8. SOURCE: "Have you been around anyone with boils or other Staph infections?" "Have you ever had boils before?" ?    Unknown . Has had boils, "lumps" like this  before ?9. OTHER SYMPTOMS: "Do you have any other symptoms?" (e.g., shaking chills, weakness, rash elsewhere on body) ?    Denies  ?10. PREGNANCY: "Is there any chance you are pregnant?" "When was your last menstrual period?" ?      na ? ?Protocols used: Boil (Skin Abscess)-A-AH ? ?

## 2022-01-03 NOTE — Telephone Encounter (Signed)
Summary: Spot on chest that is draining  ? Wife called back to inquire if anyone could see this pt today or tomorrow for the "spot" on his chest.  She says it is draining a creamy white substance nd she is concerned.  ?Contacted Angie to call the pt back.  I did not see this message at first.  ? ?----- Message from Erick Blinks sent at 01/03/2022 11:54 AM EDT -----  ?Pt has a big sore on his chest that is draining, pt's wife is concerned and wants to speak to a nurse  ? ?Best contact: [phone]   ?  ? ? ?See previous encounter. Recommended to go to UC/ED for evaluation.  ?

## 2023-11-10 DIAGNOSIS — M25562 Pain in left knee: Secondary | ICD-10-CM | POA: Diagnosis not present

## 2023-11-14 DIAGNOSIS — M25462 Effusion, left knee: Secondary | ICD-10-CM | POA: Diagnosis not present

## 2023-12-29 DIAGNOSIS — M25572 Pain in left ankle and joints of left foot: Secondary | ICD-10-CM | POA: Diagnosis not present

## 2024-04-15 DIAGNOSIS — L538 Other specified erythematous conditions: Secondary | ICD-10-CM | POA: Diagnosis not present

## 2024-06-01 DIAGNOSIS — M25562 Pain in left knee: Secondary | ICD-10-CM | POA: Diagnosis not present

## 2024-06-01 DIAGNOSIS — R21 Rash and other nonspecific skin eruption: Secondary | ICD-10-CM | POA: Diagnosis not present

## 2024-06-01 DIAGNOSIS — R42 Dizziness and giddiness: Secondary | ICD-10-CM | POA: Diagnosis not present

## 2024-07-23 DIAGNOSIS — R7303 Prediabetes: Secondary | ICD-10-CM | POA: Diagnosis not present

## 2024-07-23 DIAGNOSIS — E782 Mixed hyperlipidemia: Secondary | ICD-10-CM | POA: Diagnosis not present

## 2024-07-23 DIAGNOSIS — Z79899 Other long term (current) drug therapy: Secondary | ICD-10-CM | POA: Diagnosis not present

## 2024-07-27 DIAGNOSIS — Z1331 Encounter for screening for depression: Secondary | ICD-10-CM | POA: Diagnosis not present

## 2024-07-27 DIAGNOSIS — Z0000 Encounter for general adult medical examination without abnormal findings: Secondary | ICD-10-CM | POA: Diagnosis not present

## 2024-09-08 DIAGNOSIS — L308 Other specified dermatitis: Secondary | ICD-10-CM | POA: Diagnosis not present

## 2024-09-08 DIAGNOSIS — L309 Dermatitis, unspecified: Secondary | ICD-10-CM | POA: Diagnosis not present
